# Patient Record
Sex: Female | Born: 1985 | Race: White | Hispanic: No | Marital: Married | Smoking: Current every day smoker
Health system: Southern US, Community
[De-identification: ages and names within clinical notes are randomized; demographics above are authoritative.]

## PROBLEM LIST (undated history)

## (undated) DIAGNOSIS — B182 Chronic viral hepatitis C: Secondary | ICD-10-CM

## (undated) DIAGNOSIS — F329 Major depressive disorder, single episode, unspecified: Secondary | ICD-10-CM

## (undated) DIAGNOSIS — F419 Anxiety disorder, unspecified: Secondary | ICD-10-CM

## (undated) DIAGNOSIS — F32A Depression, unspecified: Secondary | ICD-10-CM

## (undated) HISTORY — DX: Chronic viral hepatitis C: B18.2

## (undated) HISTORY — DX: Major depressive disorder, single episode, unspecified: F32.9

## (undated) HISTORY — DX: Depression, unspecified: F32.A

## (undated) HISTORY — DX: Anxiety disorder, unspecified: F41.9

---
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
## 2006-10-27 ENCOUNTER — Emergency Department: Payer: Self-pay | Admitting: Emergency Medicine

## 2008-11-10 ENCOUNTER — Emergency Department: Payer: Self-pay | Admitting: Emergency Medicine

## 2009-01-09 ENCOUNTER — Emergency Department: Payer: Self-pay | Admitting: Emergency Medicine

## 2009-03-26 ENCOUNTER — Emergency Department: Payer: Self-pay | Admitting: Unknown Physician Specialty

## 2009-04-29 ENCOUNTER — Emergency Department: Payer: Self-pay | Admitting: Emergency Medicine

## 2009-06-27 ENCOUNTER — Emergency Department: Payer: Self-pay | Admitting: Emergency Medicine

## 2009-08-22 IMAGING — CT CT HEAD WITHOUT CONTRAST
2 series · 15 of 30 positions shown, 19 images · non-contrast
Comparison: none

REASON FOR EXAM: headache sinus congestion (please also evaluate sinuses)
report of history of Bu
COMMENTS:

[Series 2: without · axial · non-contrast · 0.41mm/px · z∈[-156,-42]mm · 13 of 35 slices shown, 17 images]
[im 3/35  brain]
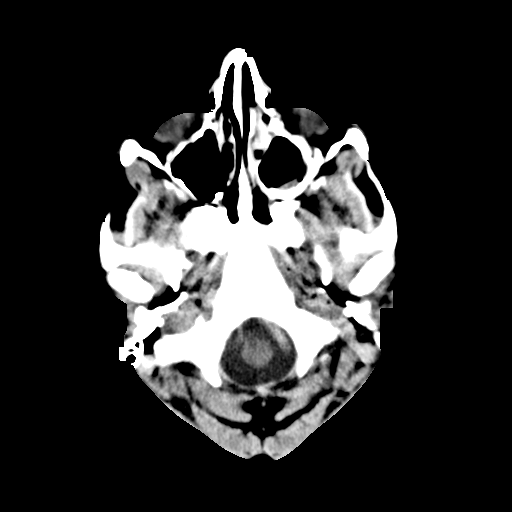
[im 3/35  bone]
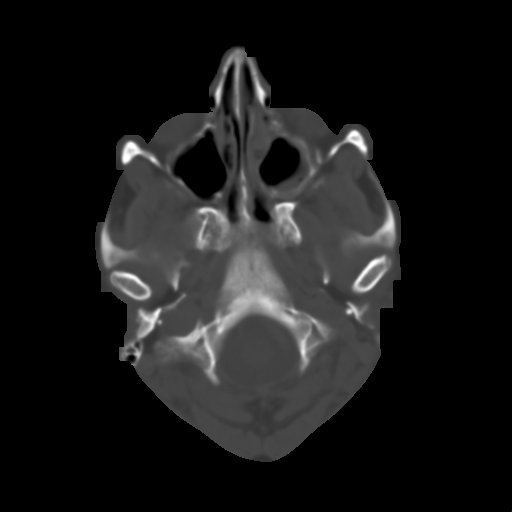
[im 5/35  brain]
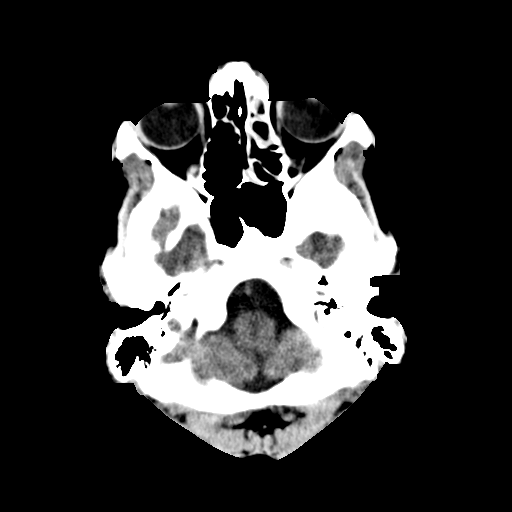
[im 8/35  brain]
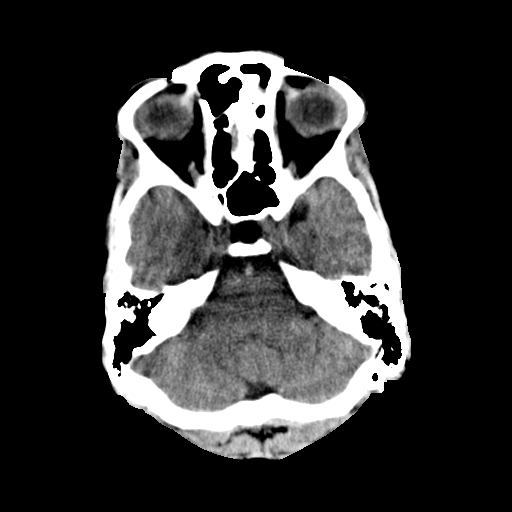
[im 10/35  brain]
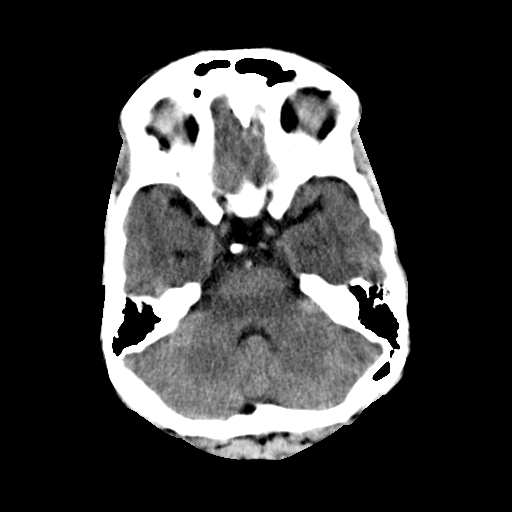
[im 13/35  brain]
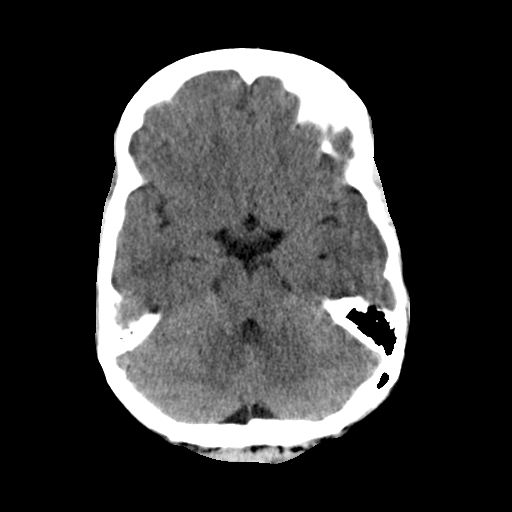
[im 13/35  bone]
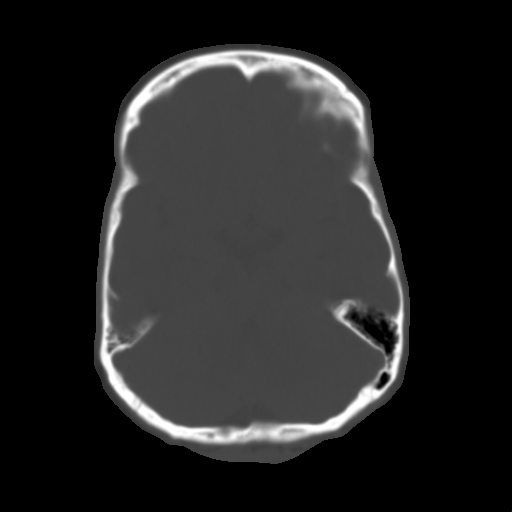
[im 15/35  brain]
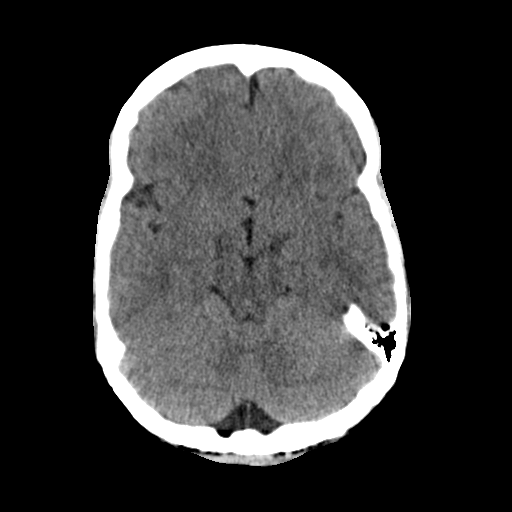
[im 18/35  brain]
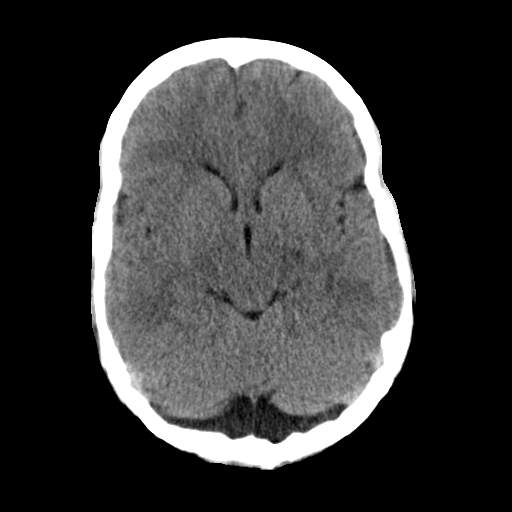
[im 20/35  brain]
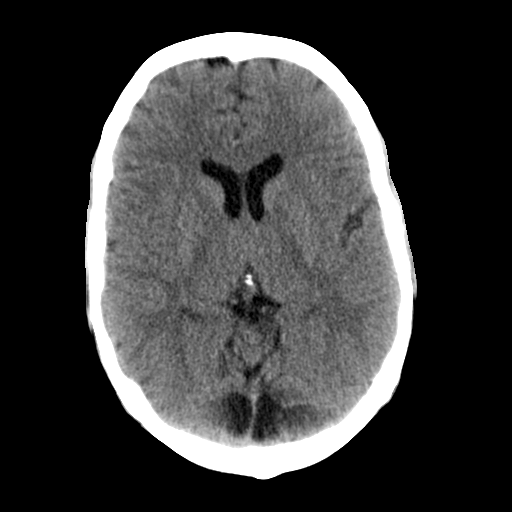
[im 22/35  brain]
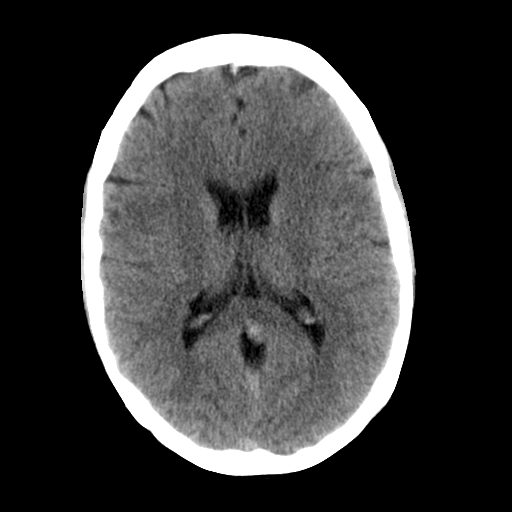
[im 22/35  bone]
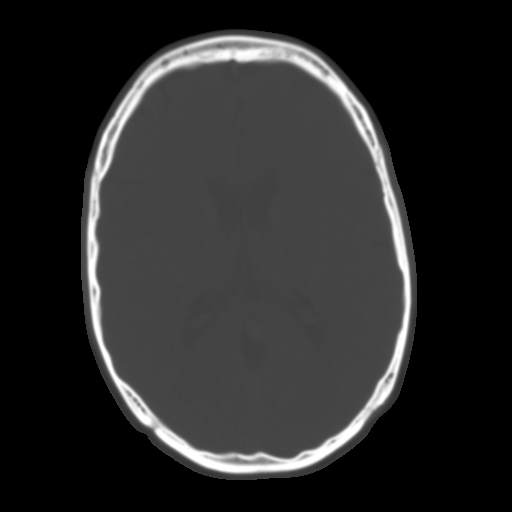
[im 25/35  brain]
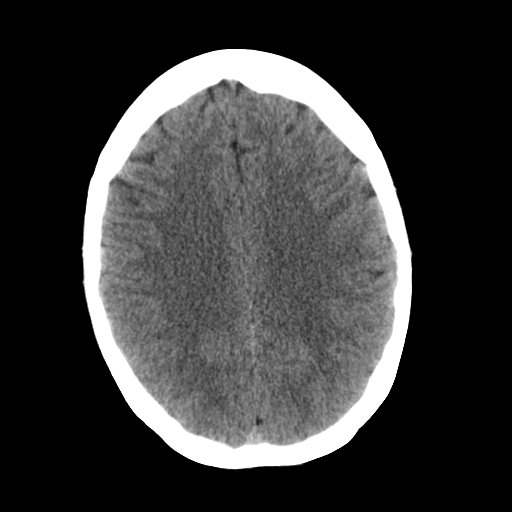
[im 27/35  brain]
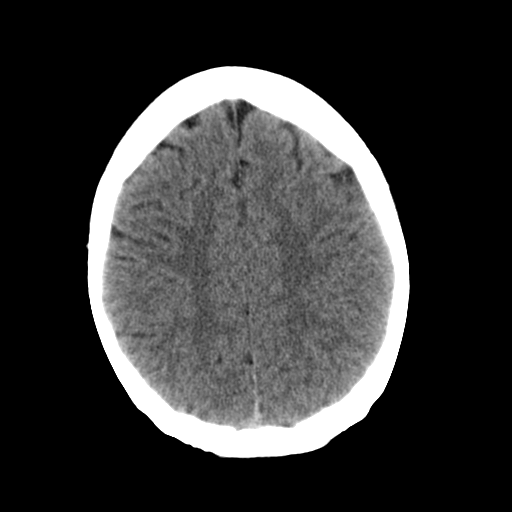
[im 30/35  brain]
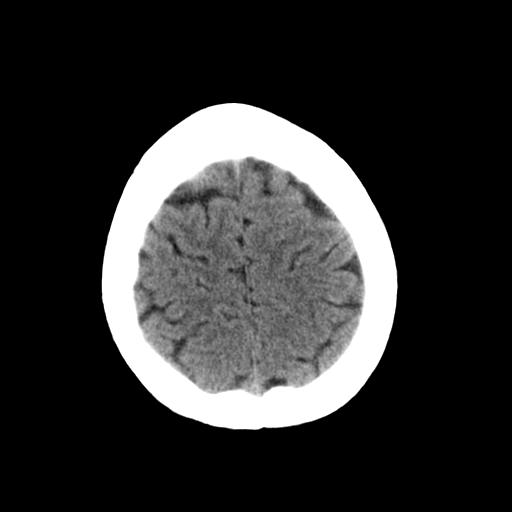
[im 32/35  brain]
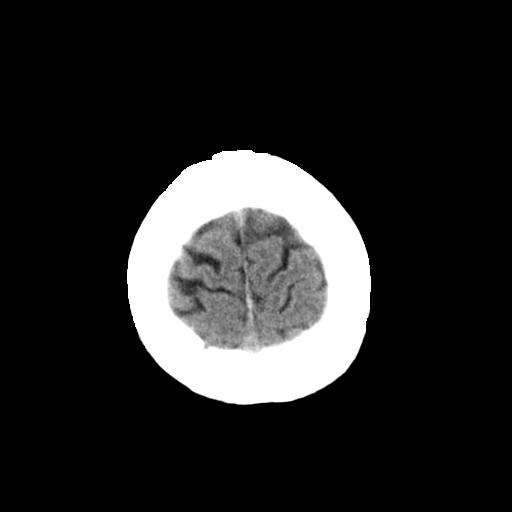
[im 32/35  bone]
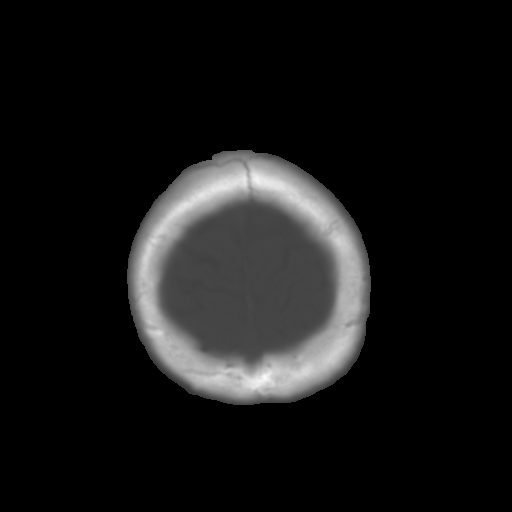

[Series 3: bone · axial · 0.41mm/px · z∈[-156,-136]mm · 2 of 35 slices shown]
[im 3/35  bone]
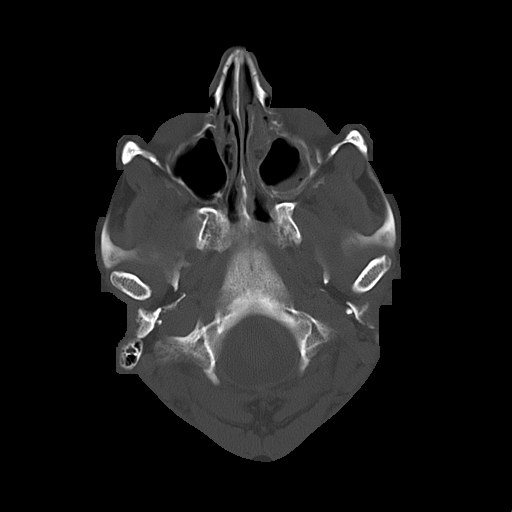
[im 8/35  bone]
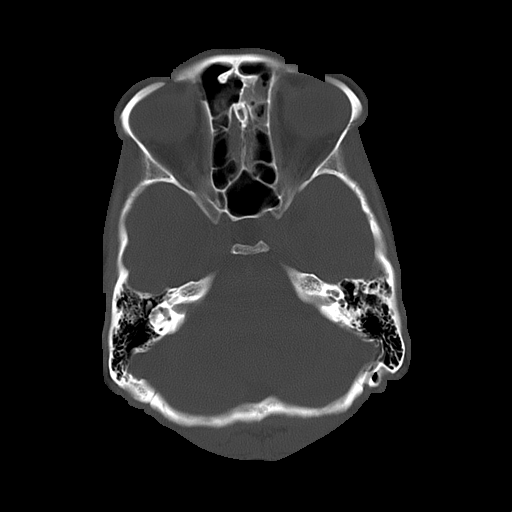

[15 of 30 positions shown; findings below may reference images not displayed]

PROCEDURE:     CT  - CT HEAD WITHOUT CONTRAST  - January 09, 2009  [DATE]

RESULT:     Axial noncontrast CT scanning was performed through the brain at
5 mm intervals and slice thicknesses.

The ventricles are normal in size and position. There is a megacisterna
magna present which is a normal variant. There is no evidence of
intracranial hemorrhage nor intracranial mass effect. There are no findings
suspicious for an acute evolving ischemic infarction. The cerebellum and
brainstem exhibit no acute abnormality.

At bone window settings there is mucoperiosteal thickening involving the
ethmoid and left maxillary sinuses with an air-fluid level noted in the left
maxillary sinus where visualized. Right maxillary sinus as well as the
sphenoid sinuses and the frontal sinuses are grossly clear. The mastoid air
cells are well pneumatized. There is no evidence of an acute skull fracture.
IMPRESSION: 1. I see no acute intracranial abnormality of the brain.
2. There is evidence of sinus inflammation involving the ethmoid and left
maxillary sinuses with an air-fluid level in the left maxillary sinus.

A preliminary report was sent to the [HOSPITAL] the conclusion
of the study.

## 2009-10-09 ENCOUNTER — Inpatient Hospital Stay: Payer: Self-pay | Admitting: Psychiatry

## 2009-11-04 ENCOUNTER — Emergency Department: Payer: Self-pay | Admitting: Emergency Medicine

## 2009-11-06 IMAGING — CR DG CHEST 2V
1 series · 2 of 2 positions shown · non-contrast
Comparison: none

REASON FOR EXAM: chest pain
COMMENTS:

PROCEDURE:     DXR - DXR CHEST PA (OR AP) AND LATERAL  - March 26, 2009 [DATE]
RESULT:     The lung fields are clear. The heart, mediastinal and osseous
structures reveal no significant abnormalities.

[Series 1: view not recorded · 0.17mm/px · 2 of 2 slices shown]
[im 1/2]
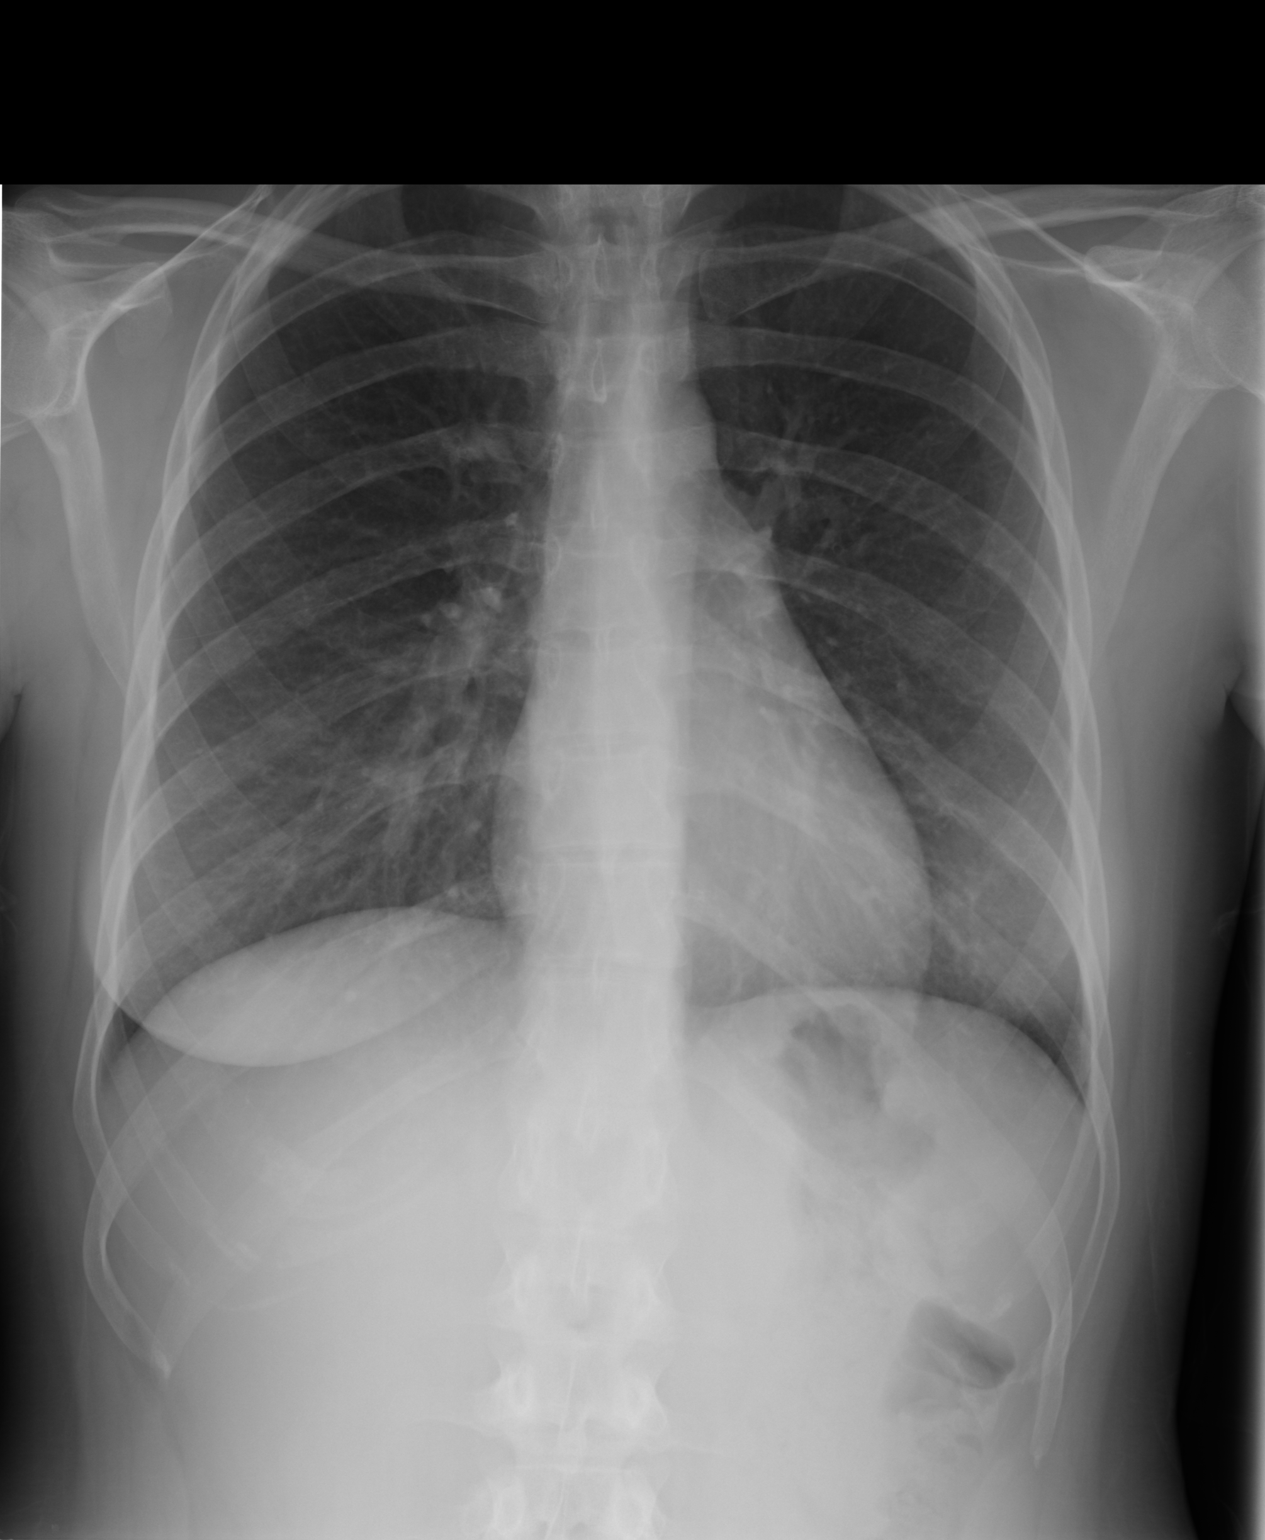
[im 2/2]
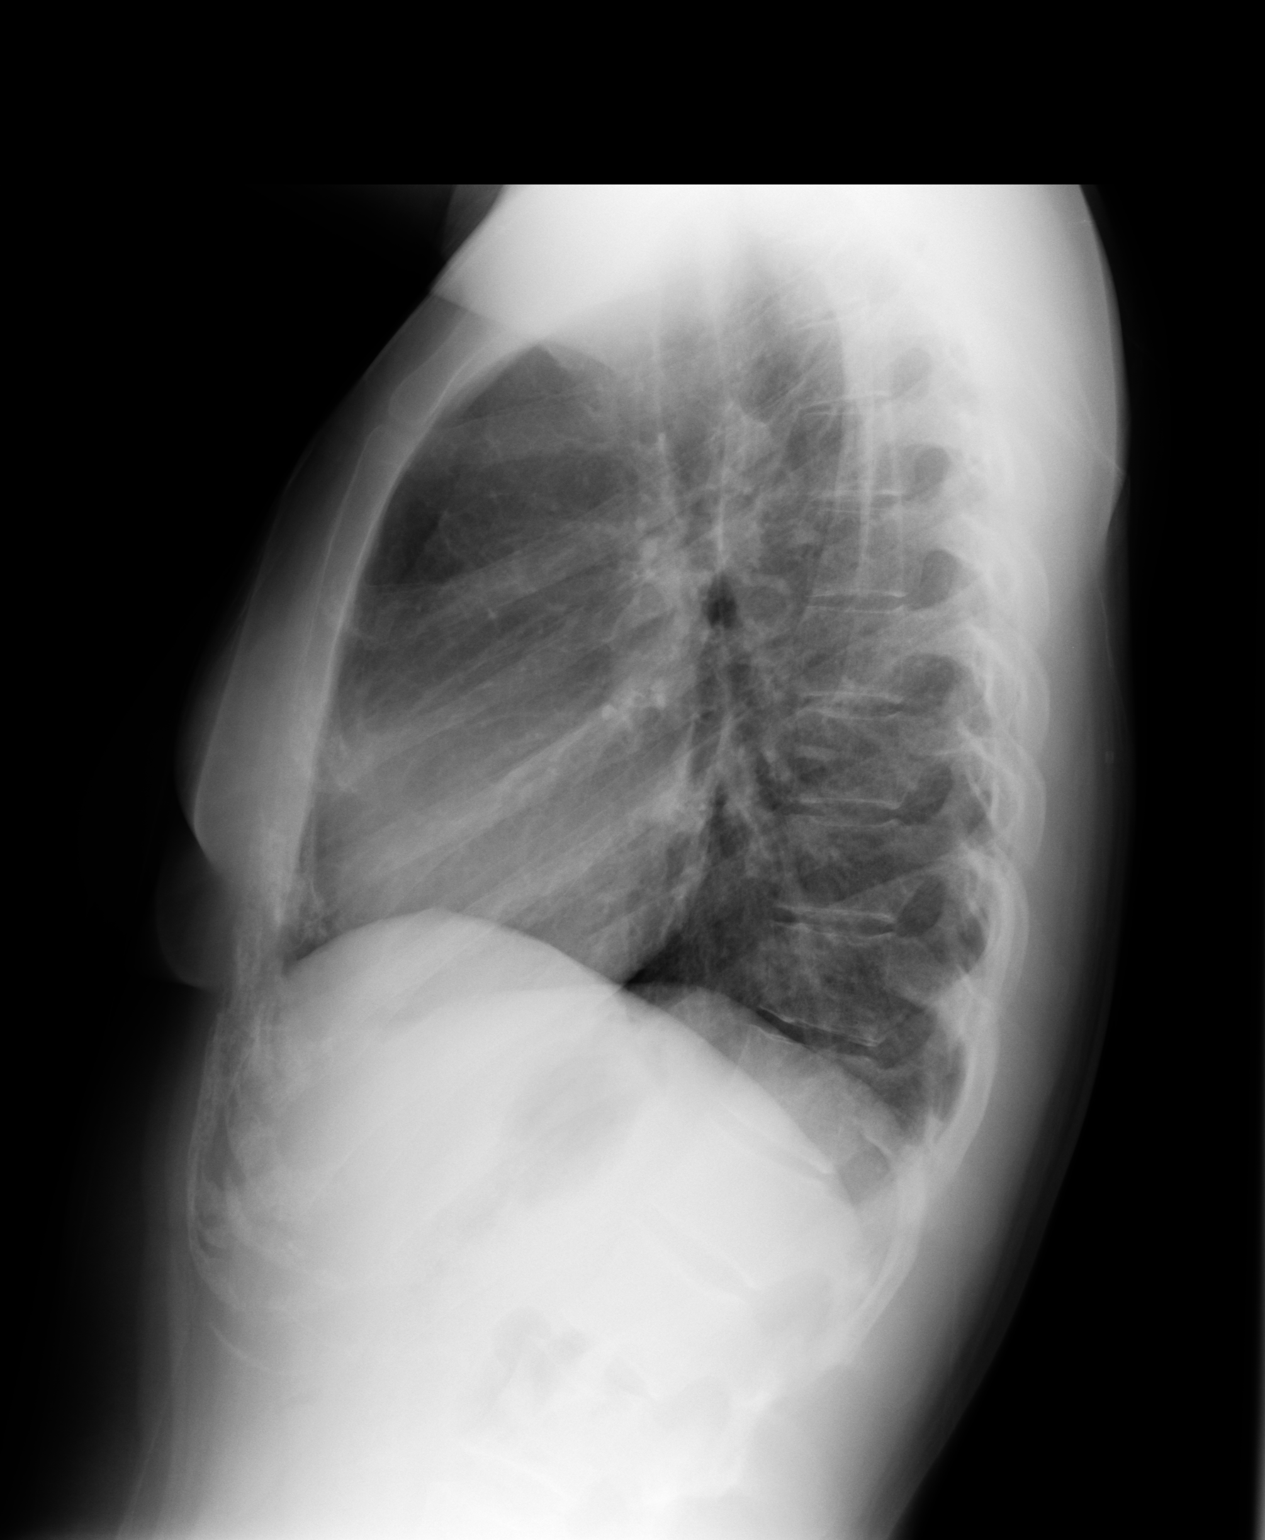

[2 of 2 positions shown; findings below may reference images not displayed]

IMPRESSION: 1.     No acute changes are identified.

## 2010-02-11 ENCOUNTER — Emergency Department: Payer: Self-pay | Admitting: Emergency Medicine

## 2010-07-21 ENCOUNTER — Emergency Department: Payer: Self-pay | Admitting: Emergency Medicine

## 2014-02-22 ENCOUNTER — Inpatient Hospital Stay: Payer: Self-pay | Admitting: Obstetrics and Gynecology

## 2014-02-22 LAB — CBC WITH DIFFERENTIAL/PLATELET
Basophil #: 0.1 10*3/uL (ref 0.0–0.1)
Basophil %: 0.6 %
EOS PCT: 0.1 %
Eosinophil #: 0 10*3/uL (ref 0.0–0.7)
HCT: 39.1 % (ref 35.0–47.0)
HGB: 13.3 g/dL (ref 12.0–16.0)
Lymphocyte #: 1.9 10*3/uL (ref 1.0–3.6)
Lymphocyte %: 11.6 %
MCH: 32.3 pg (ref 26.0–34.0)
MCHC: 34 g/dL (ref 32.0–36.0)
MCV: 95 fL (ref 80–100)
MONO ABS: 0.7 x10 3/mm (ref 0.2–0.9)
Monocyte %: 4.4 %
NEUTROS PCT: 83.3 %
Neutrophil #: 14 10*3/uL — ABNORMAL HIGH (ref 1.4–6.5)
Platelet: 394 10*3/uL (ref 150–440)
RBC: 4.11 10*6/uL (ref 3.80–5.20)
RDW: 12.9 % (ref 11.5–14.5)
WBC: 16.8 10*3/uL — AB (ref 3.6–11.0)

## 2014-02-23 LAB — HEMATOCRIT: HCT: 31.7 % — AB (ref 35.0–47.0)

## 2014-05-12 ENCOUNTER — Ambulatory Visit: Payer: Self-pay | Admitting: Internal Medicine

## 2014-11-26 ENCOUNTER — Ambulatory Visit: Payer: Self-pay | Admitting: Physician Assistant

## 2015-02-26 NOTE — H&P (Signed)
L&D Evaluation:  History:  HPI 29 year old G1 p0 with EDC=02/23/2014  by LMP= 05/19/2013 presents with painful contractions since 0200 this AM. Denies LOF or bleeding. Has nausea and urinary frequency. PNC at [facility] for early care, a negative first trimester test and MSAFP, anxiety (was on Effexor prior to pregnancy), genital warts, and a net weight gain 2#.  received TDAP 2/25. LABS: O POS, RNI, VI, GBS negative. Wants to bottle feed   Presents with contractions   Patient's Medical History Anxiety   Patient's Surgical History none   Medications Pre Natal Vitamins   Allergies NKDA   Social History tobacco  1/2 PPD   Family History Non-Contributory   ROS:  ROS see HPI   Exam:  General moaning with ctxs, constantly changing positions   Mental Status clear   Chest clear   Heart normal sinus rhythm, no murmur/gallop/rubs   Abdomen gravid, tender with contractions   Estimated Fetal Weight Average for gestational age, 7#   Fetal Position cephalic   Edema no edema   Pelvic 3/C/-1   Mebranes Intact   FHT baseline 125-130 with accels to 150 and ?variable/early  decels with some ctxs   FHT Description mod variability   Ucx q2-5 min apart (difficult to pick up with toco)   Skin dry   Impression:  Impression IUP at 39.6 weeks in early labor   Plan:  Plan EFM/NST, monitor contractions and for cervical change, monitor BP, Admit. Stadol for pain. Zofran for nausea. Epidural when available.   Electronic Signatures: Trinna BalloonGutierrez, Rhyan Wolters L (CNM)  (Signed 07-Keathley-15 08:37)  Authored: L&D Evaluation   Last Updated: 07-Amborn-15 08:37 by Trinna BalloonGutierrez, Belinda Bringhurst L (CNM)

## 2016-04-27 ENCOUNTER — Ambulatory Visit (INDEPENDENT_AMBULATORY_CARE_PROVIDER_SITE_OTHER): Payer: Self-pay | Admitting: Family Medicine

## 2016-04-27 ENCOUNTER — Other Ambulatory Visit: Payer: Self-pay | Admitting: Family Medicine

## 2016-04-27 ENCOUNTER — Encounter: Payer: Self-pay | Admitting: Family Medicine

## 2016-04-27 VITALS — BP 102/62 | HR 86 | Temp 98.5°F | Resp 16 | Ht 69.0 in | Wt 208.0 lb

## 2016-04-27 DIAGNOSIS — Z7189 Other specified counseling: Secondary | ICD-10-CM

## 2016-04-27 DIAGNOSIS — R894 Abnormal immunological findings in specimens from other organs, systems and tissues: Secondary | ICD-10-CM

## 2016-04-27 DIAGNOSIS — N3281 Overactive bladder: Secondary | ICD-10-CM | POA: Insufficient documentation

## 2016-04-27 DIAGNOSIS — Z8249 Family history of ischemic heart disease and other diseases of the circulatory system: Secondary | ICD-10-CM | POA: Insufficient documentation

## 2016-04-27 DIAGNOSIS — F418 Other specified anxiety disorders: Secondary | ICD-10-CM

## 2016-04-27 DIAGNOSIS — Z720 Tobacco use: Secondary | ICD-10-CM

## 2016-04-27 DIAGNOSIS — G2581 Restless legs syndrome: Secondary | ICD-10-CM | POA: Insufficient documentation

## 2016-04-27 DIAGNOSIS — F419 Anxiety disorder, unspecified: Secondary | ICD-10-CM

## 2016-04-27 DIAGNOSIS — F32A Depression, unspecified: Secondary | ICD-10-CM | POA: Insufficient documentation

## 2016-04-27 DIAGNOSIS — F17200 Nicotine dependence, unspecified, uncomplicated: Secondary | ICD-10-CM | POA: Insufficient documentation

## 2016-04-27 DIAGNOSIS — Z7689 Persons encountering health services in other specified circumstances: Secondary | ICD-10-CM

## 2016-04-27 DIAGNOSIS — R768 Other specified abnormal immunological findings in serum: Secondary | ICD-10-CM

## 2016-04-27 DIAGNOSIS — F329 Major depressive disorder, single episode, unspecified: Secondary | ICD-10-CM | POA: Insufficient documentation

## 2016-04-27 MED ORDER — TOLTERODINE TARTRATE ER 4 MG PO CP24: 4.0000 mg | ORAL_CAPSULE | Freq: Every day | ORAL | Status: DC

## 2016-04-27 MED ORDER — GABAPENTIN 300 MG PO CAPS: 300.0000 mg | ORAL_CAPSULE | Freq: Three times a day (TID) | ORAL | Status: DC

## 2016-04-27 MED ORDER — MIRTAZAPINE 30 MG PO TABS: 30.0000 mg | ORAL_TABLET | Freq: Every day | ORAL | Status: DC

## 2016-04-27 NOTE — Assessment & Plan Note (Signed)
Renew tolterdine once daily.

## 2016-04-27 NOTE — Progress Notes (Signed)
Subjective:    Patient ID: Kathryn Reid, female    DOB: 26-Jul-1986, 30 y.o.   MRN: [id number]  HPI: Kathryn Reid is a 30 y.o. female presenting on 04/27/2016 for Establish Care   HPI  Pt presents to establish care today. Previous care provider was Dr. Lacie Scotts-  About 5 mos ago; Had a provider in [place]- unsure of the name- seen last month  It has been 1 month since Her last PCP visit. Records from previous provider will be requested and reviewed. Current medical problems include: Anxiety and Depression: Treated by Lynett Fish. However provider in [place] changed her to Remeron and Effexor. Dr. Janeece Riggers had previously had her on clonazepam and seroquel. She is taking clonazepam 2-3 times as needed.  Overactive bladder- Taking oxybutnin and tolterdine 4mg  to help with voiding symptoms. Has been taking medications for 1 year.  RLS: Was previously taking gabapentin for RLS. Did help her symptoms. Has been out.  Hep C: Recently found out- found in [place]. 1 previous pregnancy- child is 2. Gave birth at [facility]. 1 monogamous. Previous history of multiple partners. No IV drug use. Has home-made tattoo's.   Health maintenance:  Pap smear: Feb 2017 ASCUS- due Feb 2018. Smoker 1 pack per day.      Past Medical History  Diagnosis Date  . Hep C w/ coma, chronic (HCC)   . Anxiety   . Depression    Social History   Social History  . Marital Status: Divorced    Spouse Name: N/A  . Number of Children: N/A  . Years of Education: N/A   Occupational History  . Not on file.   Social History Main Topics  . Smoking status: Current Every Day Smoker -- 1.00 packs/day  . Smokeless tobacco: Not on file  . Alcohol Use: No  . Drug Use: No  . Sexual Activity: Not on file   Other Topics Concern  . Not on file   Social History Narrative  . No narrative on file   Family History  Problem Relation Age of Onset  . Alcohol abuse Mother   . Diabetes Maternal Grandmother   . Cancer Maternal Grandmother      lymphoma  . Heart disease Maternal Grandmother   . Stroke Maternal Grandmother    No current outpatient prescriptions on file prior to visit.   No current facility-administered medications on file prior to visit.    Review of Systems  Constitutional: Negative for fever and chills.  HENT: Negative.   Respiratory: Negative for cough, chest tightness and wheezing.   Cardiovascular: Negative for chest pain and leg swelling.  Gastrointestinal: Negative for nausea, vomiting, abdominal pain, diarrhea and constipation.  Endocrine: Negative.  Negative for cold intolerance, heat intolerance, polydipsia, polyphagia and polyuria.  Genitourinary: Positive for urgency and frequency. Negative for dysuria and difficulty urinating.  Musculoskeletal: Negative.   Neurological: Negative for dizziness, light-headedness and numbness.  Psychiatric/Behavioral: Negative.    Per HPI unless specifically indicated above     Objective:    BP 102/62 mmHg  Pulse 86  Temp(Src) 98.5 F (36.9 C) (Oral)  Resp 16  Ht 5\' 9"  (1.753 m)  Wt 208 lb (94.348 kg)  BMI 30.70 kg/m2  Wt Readings from Last 3 Encounters:  04/27/16 208 lb (94.348 kg)    Depression screen PHQ 2/9 04/27/2016  Decreased Interest 2  Down, Depressed, Hopeless 0  PHQ - 2 Score 2  Altered sleeping 2  Tired, decreased energy 1  Change in  appetite 1  Feeling bad or failure about yourself  0  Trouble concentrating 1  Moving slowly or fidgety/restless 0  Suicidal thoughts 0  PHQ-9 Score 7     Physical Exam  Constitutional: She is oriented to person, place, and time. She appears well-developed and well-nourished.  HENT:  Head: Normocephalic and atraumatic.  Neck: Neck supple.  Cardiovascular: Normal rate, regular rhythm and normal heart sounds.  Exam reveals no gallop and no friction rub.   No murmur heard. Pulmonary/Chest: Effort normal and breath sounds normal. She has no wheezes. She exhibits no tenderness.  Abdominal: Soft.  Normal appearance and bowel sounds are normal. She exhibits no distension and no mass. There is no tenderness. There is no rebound and no guarding.  Musculoskeletal: Normal range of motion. She exhibits no edema or tenderness.  Lymphadenopathy:    She has no cervical adenopathy.  Neurological: She is alert and oriented to person, place, and time.  Skin: Skin is warm and dry.  Psychiatric: Her speech is normal and behavior is normal. Judgment and thought content normal. Her affect is blunt. Cognition and memory are normal.   Results for orders placed or performed in visit on 02/22/14  CBC with Differential/Platelet  Result Value Ref Range   WBC 16.8 (H) 3.6-11.0 x10 3/mm 3   RBC 4.11 3.80-5.20 X10 6/mm 3   HGB 13.3 12.0-16.0 g/dL   HCT 44.039.1 10.2-72.535.0-47.0 %   MCV 95 80-100 fL   MCH 32.3 26.0-34.0 pg   MCHC 34.0 32.0-36.0 g/dL   RDW 36.612.9 44.0-34.711.5-14.5 %   Platelet 394 150-440 x10 3/mm 3   Neutrophil % 83.3 %   Lymphocyte % 11.6 %   Monocyte % 4.4 %   Eosinophil % 0.1 %   Basophil % 0.6 %   Neutrophil # 14.0 (H) 1.4-6.5 x10 3/mm 3   Lymphocyte # 1.9 1.0-3.6 x10 3/mm 3   Monocyte # 0.7 0.2-0.9 x10 3/mm    Eosinophil # 0.0 0.0-0.7 x10 3/mm 3   Basophil # 0.1 0.0-0.1 x10 3/mm 3  Hematocrit  Result Value Ref Range   HCT 31.7 (L) 35.0-47.0 %      Assessment & Plan:   Problem List Items Addressed This Visit      Genitourinary   Overactive bladder    Renew tolterdine once daily.       Relevant Medications   tolterodine (DETROL LA) 4 MG 24 hr capsule     Other   Anxiety and depression    Managed by Dr. Janeece RiggersSu.  Will refill Remeron started by [place] provider for 1 mos until she can see Dr. Janeece RiggersSu.        Relevant Medications   mirtazapine (REMERON) 30 MG tablet   Other Relevant Orders   BASIC METABOLIC PANEL WITH GFR   Hepatitis C antibody test positive    Test RNA of virus. Refer to GI with chronic infection for treatment.       Relevant Orders   Hepatitis C Antibody   HCV-RNA,  Quant Real-Time PCR w/reflex   Family history of heart disease   Relevant Orders   Lipid Profile   RLS (restless legs syndrome)    Treated with gabapentin in the past. Would like to restart treatment. Will renew medications. Check CBC and iron studies for impact on RLS.       Relevant Medications   gabapentin (NEURONTIN) 300 MG capsule   Other Relevant Orders   CBC with Differential/Platelet   Ferritin   Current  smoker    Pt encouraged to quit smoking.        Other Visit Diagnoses    Encounter to establish care    -  Primary       Meds ordered this encounter  Medications  . cyclobenzaprine (FLEXERIL) 10 MG tablet    Sig:     Refill:  4  . DISCONTD: gabapentin (NEURONTIN) 300 MG capsule    Sig: Take 300 mg by mouth.  . DISCONTD: tolterodine (DETROL LA) 4 MG 24 hr capsule    Sig: Take 1 capsule (4 mg total) by mouth daily. Reported on 04/27/2016    Dispense:  30 capsule    Refill:  11    Order Specific Question:  Supervising Provider    Answer:  Janeann Forehand [phone]  . levonorgestrel (MIRENA) 20 MCG/24HR IUD    Sig: 1 each by Intrauterine route once.  . gabapentin (NEURONTIN) 300 MG capsule    Sig: Take 1 capsule (300 mg total) by mouth 3 (three) times daily.    Dispense:  90 capsule    Refill:  11    Order Specific Question:  Supervising Provider    Answer:  Janeann Forehand [phone]  . mirtazapine (REMERON) 30 MG tablet    Sig: Take 1 tablet (30 mg total) by mouth at bedtime.    Dispense:  30 tablet    Refill:  0    Order Specific Question:  Supervising Provider    Answer:  Janeann Forehand [phone]  . tolterodine (DETROL LA) 4 MG 24 hr capsule    Sig: Take 1 capsule (4 mg total) by mouth daily. Reported on 04/27/2016    Dispense:  30 capsule    Refill:  11    Order Specific Question:  Supervising Provider    Answer:  Janeann Forehand [phone]      Follow up plan: Return in about 2 months (around 06/28/2016) for RLS.Marland Kitchen

## 2016-04-27 NOTE — Assessment & Plan Note (Signed)
Treated with gabapentin in the past. Would like to restart treatment. Will renew medications. Check CBC and iron studies for impact on RLS.

## 2016-04-27 NOTE — Patient Instructions (Signed)
We will check some lab work to follow-up on the antibody screen found in [place].    I have filled your medications for you today.

## 2016-04-27 NOTE — Assessment & Plan Note (Signed)
Pt encouraged to quit smoking  

## 2016-04-27 NOTE — Assessment & Plan Note (Signed)
Managed by Dr. Su.  Will refill RemerJaneece Riggerson started by [place] provider for 1 mos until she can see Dr. Janeece RiggersSu.

## 2016-04-27 NOTE — Assessment & Plan Note (Signed)
Test RNA of virus. Refer to GI with chronic infection for treatment.

## 2016-04-28 LAB — CBC WITH DIFFERENTIAL/PLATELET
BASOS ABS: 0 {cells}/uL (ref 0–200)
Basophils Relative: 0 %
EOS PCT: 7 %
Eosinophils Absolute: 602 cells/uL — ABNORMAL HIGH (ref 15–500)
HCT: 39 % (ref 35.0–45.0)
Hemoglobin: 13.1 g/dL (ref 11.7–15.5)
Lymphocytes Relative: 37 %
Lymphs Abs: 3182 cells/uL (ref 850–3900)
MCH: 32 pg (ref 27.0–33.0)
MCHC: 33.6 g/dL (ref 32.0–36.0)
MCV: 95.4 fL (ref 80.0–100.0)
MONOS PCT: 6 %
MPV: 9.2 fL (ref 7.5–12.5)
Monocytes Absolute: 516 cells/uL (ref 200–950)
NEUTROS PCT: 50 %
Neutro Abs: 4300 cells/uL (ref 1500–7800)
PLATELETS: 366 10*3/uL (ref 140–400)
RBC: 4.09 MIL/uL (ref 3.80–5.10)
RDW: 13.3 % (ref 11.0–15.0)
WBC: 8.6 10*3/uL (ref 3.8–10.8)

## 2016-04-28 LAB — LIPID PANEL
Cholesterol: 202 mg/dL — ABNORMAL HIGH (ref 125–200)
HDL: 51 mg/dL (ref >=46)
LDL Cholesterol: 119 mg/dL (ref <130)
Total CHOL/HDL Ratio: 4 Ratio (ref <=5.0)
Triglycerides: 161 mg/dL — ABNORMAL HIGH (ref <150)
VLDL: 32 mg/dL — ABNORMAL HIGH (ref <30)

## 2016-04-28 LAB — BASIC METABOLIC PANEL WITH GFR
BUN: 7 mg/dL (ref 7–25)
CALCIUM: 8.9 mg/dL (ref 8.6–10.2)
CO2: 26 mmol/L (ref 20–31)
CREATININE: 0.69 mg/dL (ref 0.50–1.10)
Chloride: 101 mmol/L (ref 98–110)
GLUCOSE: 79 mg/dL (ref 65–99)
Potassium: 4.3 mmol/L (ref 3.5–5.3)
Sodium: 135 mmol/L (ref 135–146)

## 2016-04-28 LAB — FERRITIN: FERRITIN: 23 ng/mL (ref 10–154)

## 2016-04-28 LAB — HEPATITIS C ANTIBODY: HCV AB: REACTIVE — AB

## 2016-04-29 ENCOUNTER — Encounter: Payer: Self-pay | Admitting: Family Medicine

## 2016-04-29 LAB — HEPATITIS C RNA QUANTITATIVE: HCV Quantitative: NOT DETECTED IU/mL (ref <15)

## 2016-04-30 LAB — HCV RNA, QUANT REAL-TIME PCR W/REFLEX: HCV RNA, PCR, QN (Log): <1.18 LogIU/mL

## 2016-05-28 ENCOUNTER — Other Ambulatory Visit: Payer: Self-pay | Admitting: Family Medicine

## 2016-05-28 DIAGNOSIS — F329 Major depressive disorder, single episode, unspecified: Secondary | ICD-10-CM

## 2016-05-28 DIAGNOSIS — F419 Anxiety disorder, unspecified: Principal | ICD-10-CM

## 2016-05-28 DIAGNOSIS — F32A Depression, unspecified: Secondary | ICD-10-CM

## 2016-06-29 ENCOUNTER — Ambulatory Visit (INDEPENDENT_AMBULATORY_CARE_PROVIDER_SITE_OTHER): Payer: BLUE CROSS/BLUE SHIELD | Admitting: Family Medicine

## 2016-06-29 ENCOUNTER — Encounter: Payer: Self-pay | Admitting: Family Medicine

## 2016-06-29 VITALS — BP 101/57 | HR 78 | Temp 98.4°F | Resp 16 | Ht 69.0 in | Wt 211.8 lb

## 2016-06-29 DIAGNOSIS — N3281 Overactive bladder: Secondary | ICD-10-CM

## 2016-06-29 DIAGNOSIS — F418 Other specified anxiety disorders: Secondary | ICD-10-CM

## 2016-06-29 DIAGNOSIS — Z23 Encounter for immunization: Secondary | ICD-10-CM | POA: Diagnosis not present

## 2016-06-29 DIAGNOSIS — F32A Depression, unspecified: Secondary | ICD-10-CM

## 2016-06-29 DIAGNOSIS — F329 Major depressive disorder, single episode, unspecified: Secondary | ICD-10-CM

## 2016-06-29 DIAGNOSIS — G2581 Restless legs syndrome: Secondary | ICD-10-CM

## 2016-06-29 DIAGNOSIS — F419 Anxiety disorder, unspecified: Secondary | ICD-10-CM

## 2016-06-29 MED ORDER — CYCLOBENZAPRINE HCL 10 MG PO TABS: 10.0000 mg | ORAL_TABLET | Freq: Two times a day (BID) | ORAL | 11 refills | Status: DC

## 2016-06-29 MED ORDER — VENLAFAXINE HCL ER 75 MG PO CP24: 75.0000 mg | ORAL_CAPSULE | Freq: Every day | ORAL | 3 refills | Status: DC

## 2016-06-29 NOTE — Assessment & Plan Note (Signed)
Renewed Gabapentin and flexeril today to help with RLS symptoms. Reviewed non-pharmacological management such as avoiding alcohol, sleep hygiene.  Recheck 6 mos.

## 2016-06-29 NOTE — Assessment & Plan Note (Signed)
Controlled on current medications. Recheck 6 mos.

## 2016-06-29 NOTE — Assessment & Plan Note (Signed)
Managed by Dr. Su 

## 2016-06-29 NOTE — Patient Instructions (Addendum)
Restless Legs Syndrome Restless legs syndrome is a condition that causes uncomfortable feelings or sensations in the legs, especially while sitting or lying down. The sensations usually cause an overwhelming urge to move the legs. The arms can also sometimes be affected. The condition can range from mild to severe. The symptoms often interfere with a person's ability to sleep. CAUSES The cause of this condition is not known. RISK FACTORS This condition is more likely to develop in:  People who are older than age 50.  Pregnant women. In general, restless legs syndrome is more common in women than in men.  People who have a family history of the condition.  People who have certain medical conditions, such as iron deficiency, kidney disease, Parkinson disease, or nerve damage.  People who take certain medicines, such as medicines for high blood pressure, nausea, colds, allergies, depression, and some heart conditions. SYMPTOMS The main symptom of this condition is uncomfortable sensations in the legs. These sensations Vidrio be:  Described as pulling, tingling, prickling, throbbing, crawling, or burning.  Worse while you are sitting or lying down.  Worse during periods of rest or inactivity.  Worse at night, often interfering with your sleep.  Accompanied by a very strong urge to move your legs.  Temporarily relieved by movement of your legs. The sensations usually affect both sides of the body. The arms can also be affected, but this is rare. People who have this condition often have tiredness during the day because of their lack of sleep at night. DIAGNOSIS This condition Barman be diagnosed based on your description of the symptoms. You [place] also have tests, including blood tests, to check for other conditions that [place] lead to your symptoms. In some cases, you [place] be asked to spend some time in a sleep lab so your sleeping can be monitored. TREATMENT Treatment for this condition is  focused on managing the symptoms. Treatment [place] include:  Self-help and lifestyle changes.  Medicines. HOME CARE INSTRUCTIONS  Take medicines only as directed by your health care provider.  Try these methods to get temporary relief from the uncomfortable sensations:  Massage your legs.  Walk or stretch.  Take a cold or hot bath.  Practice good sleep habits. For example, go to bed and get up at the same time every day.  Exercise regularly.  Practice ways of relaxing, such as yoga or meditation.  Avoid caffeine and alcohol.  Do not use any tobacco products, including cigarettes, chewing tobacco, or electronic cigarettes. If you need help quitting, ask your health care provider.  Keep all follow-up visits as directed by your health care provider. This is important. SEEK MEDICAL CARE IF: Your symptoms do not improve with treatment, or they get worse.   This information is not intended to replace advice given to you by your health care provider. Make sure you discuss any questions you have with your health care provider.   Document Released: 09/25/2002 Document Revised: 02/19/2015 Document Reviewed: 10/01/2014 Elsevier Interactive Patient Education 2016 Elsevier Inc.  

## 2016-06-29 NOTE — Progress Notes (Signed)
Subjective:    Patient ID: Joelene Millin Tarnow, female    DOB: 1985-11-27, 30 y.o.   MRN: [id number]  HPI: Allysen Lazo Cowher is a 30 y.o. female presenting on 06/29/2016 for RLS (pt needs refill for flexeril)   HPI  Pt presents for follow-up of RLS symptoms. Taking gabapentin TID. Seems to help. Taking flexeril twice daily. The bulk of her symptoms are at bedtime. Takes flexeril in the afternoon and it helps with her symptoms. OAB symptoms are doing well on Detrol LA.  Would like her flu shot today.   Past Medical History:  Diagnosis Date  . Anxiety   . Depression   . Hep C w/ coma, chronic (HCC)     Current Outpatient Prescriptions on File Prior to Visit  Medication Sig  . clonazePAM (KLONOPIN) 1 MG tablet Take 1 tablet by mouth 3 (three) times daily as needed.  . gabapentin (NEURONTIN) 300 MG capsule Take 1 capsule (300 mg total) by mouth 3 (three) times daily.  Marland Kitchen levonorgestrel (MIRENA) 20 MCG/24HR IUD 1 each by Intrauterine route once.  Marland Kitchen oxybutynin (DITROPAN) 5 MG tablet Take 1 tablet by mouth daily.  . QUEtiapine (SEROQUEL) 200 MG tablet Take 1 tablet by mouth daily.  Marland Kitchen tolterodine (DETROL LA) 4 MG 24 hr capsule Take 1 capsule (4 mg total) by mouth daily. Reported on 04/27/2016   No current facility-administered medications on file prior to visit.     Review of Systems  Constitutional: Negative for chills and fever.  HENT: Negative.   Respiratory: Negative for cough, chest tightness and wheezing.   Cardiovascular: Negative for chest pain and leg swelling.  Gastrointestinal: Negative for abdominal pain, constipation, diarrhea, nausea and vomiting.  Endocrine: Negative.  Negative for cold intolerance, heat intolerance, polydipsia, polyphagia and polyuria.  Genitourinary: Negative for difficulty urinating and dysuria.  Musculoskeletal: Negative.   Neurological: Negative for dizziness, light-headedness and numbness.  Psychiatric/Behavioral: Negative.    Per HPI unless  specifically indicated above     Objective:    BP (!) 101/57 (BP Location: Right Arm, Patient Position: Sitting, Cuff Size: Normal)   Pulse 78   Temp 98.4 F (36.9 C) (Oral)   Resp 16   Ht '5\' 9"'  (1.753 m)   Wt 211 lb 12.8 oz (96.1 kg)   BMI 31.28 kg/m   Wt Readings from Last 3 Encounters:  06/29/16 211 lb 12.8 oz (96.1 kg)  04/27/16 208 lb (94.3 kg)    Physical Exam  Constitutional: She is oriented to person, place, and time. She appears well-developed and well-nourished.  HENT:  Head: Normocephalic and atraumatic.  Neck: Neck supple.  Cardiovascular: Normal rate, regular rhythm and normal heart sounds.  Exam reveals no gallop and no friction rub.   No murmur heard. Pulmonary/Chest: Effort normal and breath sounds normal. She has no wheezes. She exhibits no tenderness.  Abdominal: Soft. Normal appearance and bowel sounds are normal. She exhibits no distension and no mass. There is no tenderness. There is no rebound and no guarding.  Musculoskeletal: Normal range of motion. She exhibits no edema or tenderness.  Lymphadenopathy:    She has no cervical adenopathy.  Neurological: She is alert and oriented to person, place, and time.  Skin: Skin is warm and dry.   Results for orders placed or performed in visit on 04/27/16  Hepatitis C Antibody  Result Value Ref Range   HCV Ab REACTIVE (A) NEGATIVE  HCV-RNA, Quant Real-Time PCR w/reflex  Result Value Ref Range  HCV RNA, PCR, QN <15 IU/mL   HCV RNA, PCR, QN (Log) <1.18 LogIU/mL  BASIC METABOLIC PANEL WITH GFR  Result Value Ref Range   Sodium 135 135 - 146 mmol/L   Potassium 4.3 3.5 - 5.3 mmol/L   Chloride 101 98 - 110 mmol/L   CO2 26 20 - 31 mmol/L   Glucose, Bld 79 65 - 99 mg/dL   BUN 7 7 - 25 mg/dL   Creat 0.69 0.50 - 1.10 mg/dL   Calcium 8.9 8.6 - 10.2 mg/dL   GFR, Est African American >89 >=60 mL/min   GFR, Est Non African American >89 >=60 mL/min  Lipid Profile  Result Value Ref Range   Cholesterol 202 (H) 125 -  200 mg/dL   Triglycerides 161 (H) <150 mg/dL   HDL 51 >=46 mg/dL   Total CHOL/HDL Ratio 4.0 <=5.0 Ratio   VLDL 32 (H) <30 mg/dL   LDL Cholesterol 119 <130 mg/dL  CBC with Differential/Platelet  Result Value Ref Range   WBC 8.6 3.8 - 10.8 K/uL   RBC 4.09 3.80 - 5.10 MIL/uL   Hemoglobin 13.1 11.7 - 15.5 g/dL   HCT 39.0 35.0 - 45.0 %   MCV 95.4 80.0 - 100.0 fL   MCH 32.0 27.0 - 33.0 pg   MCHC 33.6 32.0 - 36.0 g/dL   RDW 13.3 11.0 - 15.0 %   Platelets 366 140 - 400 K/uL   MPV 9.2 7.5 - 12.5 fL   Neutro Abs 4,300 1,500 - 7,800 cells/uL   Lymphs Abs 3,182 850 - 3,900 cells/uL   Monocytes Absolute 516 200 - 950 cells/uL   Eosinophils Absolute 602 (H) 15 - 500 cells/uL   Basophils Absolute 0 0 - 200 cells/uL   Neutrophils Relative % 50 %   Lymphocytes Relative 37 %   Monocytes Relative 6 %   Eosinophils Relative 7 %   Basophils Relative 0 %   Smear Review Criteria for review not met   Ferritin  Result Value Ref Range   Ferritin 23 10 - 154 ng/mL  Hepatitis C RNA quantitative  Result Value Ref Range   HCV Quantitative Not Detected <15 IU/mL   HCV Quantitative Log NOT CALC <1.18 log 10      Assessment & Plan:   Problem List Items Addressed This Visit      Genitourinary   Overactive bladder    Controlled on current medications. Recheck 6 mos.         Other   Anxiety and depression    Managed by Dr. Kasandra Knudsen      RLS (restless legs syndrome) - Primary    Renewed Gabapentin and flexeril today to help with RLS symptoms. Reviewed non-pharmacological management such as avoiding alcohol, sleep hygiene.  Recheck 6 mos.       Relevant Medications   cyclobenzaprine (FLEXERIL) 10 MG tablet    Other Visit Diagnoses    Need for influenza vaccination       Relevant Orders   Flu Vaccine QUAD 36+ mos PF IM (Fluarix & Fluzone Quad PF) (Completed)      Meds ordered this encounter  Medications  . cyclobenzaprine (FLEXERIL) 10 MG tablet    Sig: Take 1 tablet (10 mg total) by mouth 2  (two) times daily.    Dispense:  60 tablet    Refill:  11    Order Specific Question:   Supervising Provider    Answer:   Arlis Porta [phone]  . venlafaxine XR (  EFFEXOR-XR) 75 MG 24 hr capsule    Sig: Take 1 capsule (75 mg total) by mouth daily. Reported on 04/27/2016    Dispense:  30 capsule    Refill:  3    Order Specific Question:   Supervising Provider    Answer:   Arlis Porta [[id number]]      Follow up plan: No Follow-up on file.

## 2016-09-08 ENCOUNTER — Ambulatory Visit
Admission: EM | Admit: 2016-09-08 | Discharge: 2016-09-08 | Disposition: A | Discharge status: Home / Self Care | Payer: BLUE CROSS/BLUE SHIELD | Attending: Family Medicine | Admitting: Family Medicine

## 2016-09-08 ENCOUNTER — Encounter: Payer: Self-pay | Admitting: *Deleted

## 2016-09-08 DIAGNOSIS — B86 Scabies: Secondary | ICD-10-CM | POA: Diagnosis not present

## 2016-09-08 DIAGNOSIS — R197 Diarrhea, unspecified: Secondary | ICD-10-CM | POA: Diagnosis not present

## 2016-09-08 DIAGNOSIS — R112 Nausea with vomiting, unspecified: Secondary | ICD-10-CM | POA: Diagnosis not present

## 2016-09-08 DIAGNOSIS — L03119 Cellulitis of unspecified part of limb: Secondary | ICD-10-CM

## 2016-09-08 MED ORDER — MUPIROCIN 2 % EX OINT: 1.0000 "application " | TOPICAL_OINTMENT | Freq: Three times a day (TID) | CUTANEOUS | 0 refills | Status: DC

## 2016-09-08 MED ORDER — PERMETHRIN 5 % EX CREA: TOPICAL_CREAM | CUTANEOUS | 0 refills | Status: DC

## 2016-09-08 MED ORDER — ONDANSETRON 8 MG PO TBDP: 8.0000 mg | ORAL_TABLET | Freq: Two times a day (BID) | ORAL | 0 refills | Status: DC

## 2016-09-08 MED ORDER — ONDANSETRON 8 MG PO TBDP
8.0000 mg | ORAL_TABLET | Freq: Once | ORAL | Status: AC
  Administered 2016-09-08: 8 mg via ORAL

## 2016-09-08 NOTE — ED Triage Notes (Signed)
N/V, and generalized rash, possibly insect bites, x2 days. Denies fever.

## 2016-09-08 NOTE — ED Provider Notes (Signed)
CSN: [id number]     Arrival date & time 09/08/16  1532 History   First MD Initiated Contact with Patient 09/08/16 1728     Chief Complaint  Patient presents with  . Insect Bite  . Nausea  . Emesis   (Consider location/radiation/quality/duration/timing/severity/associated sxs/prior Treatment) HPI This a 30 year old female who presents with 2 days history of vomiting and watery diarrhea and stomach pain. She has stated that the diarrhea has stopped as of yesterday but she continues to have the nausea and vomiting. She states that she is unable to even keep down fluids. She's also noticed a very itchy rash that she's had for 2 days. It affects arms legs and torso but spares her neck and face. She got it could possibly be flea bites since they have a new cat that was from a flea ridden home. 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 Several areas of excoriations that are starting to have secondary infections.      Past Medical History:  Diagnosis Date  . Anxiety   . Depression   . Hep C w/ coma, chronic (HCC)    History reviewed. No pertinent surgical history. Family History  Problem Relation Age of Onset  . Alcohol abuse Mother   . Diabetes Maternal Grandmother   . Cancer Maternal Grandmother     lymphoma  . Heart disease Maternal Grandmother   . Stroke Maternal Grandmother    Social History  Substance Use Topics  . Smoking status: Current Every Day Smoker    Packs/day: 1.00  . Smokeless tobacco: Current User  . Alcohol use No   OB History    No data available     Review of Systems  Constitutional: Positive for activity change and appetite change. Negative for chills, fatigue and fever.  Gastrointestinal: Positive for diarrhea, nausea and vomiting. Negative for abdominal distention, abdominal pain and constipation.  Skin: Positive for rash and wound.  All other systems reviewed and are negative.   Allergies  Other and Bee venom  Home  Medications   Prior to Admission medications   Medication Sig Start Date End Date Taking? Authorizing Provider  clonazePAM (KLONOPIN) 1 MG tablet Take 1 tablet by mouth 3 (three) times daily as needed. 04/01/16  Yes Historical Provider, MD  cyclobenzaprine (FLEXERIL) 10 MG tablet Take 1 tablet (10 mg total) by mouth 2 (two) times daily. 06/29/16  Yes Amy Rusty AusLauren Krebs, NP  oxybutynin (DITROPAN) 5 MG tablet Take 1 tablet by mouth daily. 03/27/16  Yes Historical Provider, MD  QUEtiapine (SEROQUEL) 200 MG tablet Take 1 tablet by mouth daily. 02/02/16  Yes Historical Provider, MD  tolterodine (DETROL LA) 4 MG 24 hr capsule Take 1 capsule (4 mg total) by mouth daily. Reported on 04/27/2016 04/27/16  Yes Amy Rusty AusLauren Krebs, NP  venlafaxine XR (EFFEXOR-XR) 75 MG 24 hr capsule Take 1 capsule (75 mg total) by mouth daily. Reported on 04/27/2016 06/29/16  Yes Amy Rusty AusLauren Krebs, NP  levonorgestrel (MIRENA) 20 MCG/24HR IUD 1 each by Intrauterine route once.    Historical Provider, MD  mupirocin ointment (BACTROBAN) 2 % Apply 1 application topically 3 (three) times daily. 09/08/16   Lutricia FeilWilliam P Aspynn Clover, PA-C  ondansetron (ZOFRAN ODT) 8 MG disintegrating tablet Take 1 tablet (8 mg total) by mouth 2 (two) times daily. 09/08/16   Lutricia FeilWilliam P Brek Reece, PA-C  permethrin (ELIMITE) 5 % cream Apply to affected area once. Do not reapply. 09/08/16   Lutricia FeilWilliam P Cheyenne Bordeaux, PA-C  Meds Ordered and Administered this Visit   Medications  ondansetron (ZOFRAN-ODT) disintegrating tablet 8 mg (8 mg Oral Given 09/08/16 1559)    BP 121/77 (BP Location: Left Arm)   Pulse 68   Temp (!) 94.8 F (34.9 C) (Tympanic)   Resp 16   Ht 5\' 9"  (1.753 m)   Wt 186 lb (84.4 kg)   SpO2 94%   BMI 27.47 kg/m  No data found.   Physical Exam  Urgent Care Course   Clinical Course     Procedures (including critical care time)  Labs Review Labs Reviewed - No data to display  Imaging Review No results found.   Visual Acuity Review  Right Eye  Distance:   Left Eye Distance:   Bilateral Distance:    Right Eye Near:   Left Eye Near:    Bilateral Near:         MDM   1. Scabies infestation   2. Nausea vomiting and diarrhea   3. Cellulitis of lower extremity, unspecified laterality    Discharge Medication List as of 09/08/2016  5:53 PM    START taking these medications   Details  mupirocin ointment (BACTROBAN) 2 % Apply 1 application topically 3 (three) times daily., Starting Tue 09/08/2016, Normal    ondansetron (ZOFRAN ODT) 8 MG disintegrating tablet Take 1 tablet (8 mg total) by mouth 2 (two) times daily., Starting Tue 09/08/2016, Normal    permethrin (ELIMITE) 5 % cream Apply to affected area once. Do not reapply., Normal      Plan: 1. Test/x-ray results and diagnosis reviewed with patient 2. rx as per orders; risks, benefits, potential side effects reviewed with patient 3. Recommend supportive treatment with  Bactroban on the areas that are having secondary infection. I reviewed in detail and have provided written information on how to treat the scabies including washing of the clothing sequestering of the non-washable items in garbage bag for 72 hours. Her partner will likely need to be treated but it cannot treat both of them without seeing him. Also given her Zofran to allow her to be able to hydrate better. She doesn't have to eat but when she does decide to return to being she should start with a brat diet and advance as tolerated 4. F/u prn if symptoms worsen or don't improve     Lutricia FeilWilliam P Glendoris Nodarse, PA-C 09/08/16 1809

## 2017-09-29 ENCOUNTER — Other Ambulatory Visit: Payer: Self-pay

## 2017-09-29 DIAGNOSIS — G2581 Restless legs syndrome: Secondary | ICD-10-CM

## 2018-03-10 ENCOUNTER — Telehealth: Payer: Self-pay | Admitting: Family Medicine

## 2018-03-10 NOTE — Telephone Encounter (Signed)
Reviewed chart. She has only been seen by Bjorn Pippin FNP twice, back in 2 months in 2017. She has not seen or established with any providers here. I would recommend that if she needs a rx EpiPen she Dolinger seek more immediate care at an Urgent Care or walk in clinic, as they should be able to write this rx for her. She would need to schedule as new patient to re-establish with [place] otherwise if preferred to proceed with discussing this rx.  Saralyn Pilar, DO [facility] 03/10/2018, 12:41 PM

## 2018-03-10 NOTE — Telephone Encounter (Signed)
Pt. Called said that she was going into drug rehab they saw  She was allergic to bees, before she can get into  Program she need a Epipen last time pt was in the office was 2017. Pt  Call back #[phone]

## 2019-03-29 LAB — HM PAP SMEAR: HM Pap smear: NEGATIVE

## 2020-04-09 ENCOUNTER — Telehealth: Payer: Self-pay | Admitting: Pharmacy Technician

## 2020-04-09 ENCOUNTER — Other Ambulatory Visit: Payer: Self-pay

## 2020-04-09 ENCOUNTER — Encounter: Payer: Self-pay | Admitting: Family

## 2020-04-09 ENCOUNTER — Ambulatory Visit (INDEPENDENT_AMBULATORY_CARE_PROVIDER_SITE_OTHER): Payer: Self-pay | Admitting: Family

## 2020-04-09 VITALS — BP 131/77 | HR 63 | Temp 97.9°F | Wt 238.0 lb

## 2020-04-09 DIAGNOSIS — R768 Other specified abnormal immunological findings in serum: Secondary | ICD-10-CM

## 2020-04-09 NOTE — Progress Notes (Signed)
Subjective:    Patient ID: Kathryn Reid, female    DOB: January 05, 1986, 34 y.o.   MRN: [id number]  Chief Complaint  Patient presents with  . Hepatitis C    HPI:  Kathryn Reid is a 34 y.o. female with previous medical history of polysubstance abuse, depression, anxiety, and hepatitis C presenting today for initial evaluation and treatment for hepatitis C.  Kathryn Reid was initially diagnosed with hepatitis C in 2017 when she was in treatment for polysubstance abuse. Risk factors for acquiring hepatitis C include previous history of IV drug use and homemade tattoos as well as sharing needles/razors. No previous history of blood transfusions prior to 1992 or sexual contact with known positive partners. No previous personal or family history of liver disease. Has not received treatment since initial diagnosis. Currently without symptoms and denies abdominal pain, nausea, vomiting, scleral icterus, jaundice, or fatigue. She has not currently using any recreational or illicit drugs. Smokes tobacco about 1 pack/day on average and not currently consuming alcohol.  Previous lab work reviewed from 04/27/2016 with positive hepatitis C antibody test and HCV viral load undetectable.  Allergies  Allergen Reactions  . Other Anaphylaxis    Bee stings  . Bee Venom       Outpatient Medications Prior to Visit  Medication Sig Dispense Refill  . oxybutynin (DITROPAN) 5 MG tablet Take 1 tablet by mouth daily.  0  . traZODone (DESYREL) 150 MG tablet Take 150 mg by mouth at bedtime.    . clonazePAM (KLONOPIN) 1 MG tablet Take 1 tablet by mouth 3 (three) times daily as needed. (Patient not taking: Reported on 04/09/2020)  2  . cyclobenzaprine (FLEXERIL) 10 MG tablet Take 1 tablet (10 mg total) by mouth 2 (two) times daily. (Patient not taking: Reported on 04/09/2020) 60 tablet 11  . levonorgestrel (MIRENA) 20 MCG/24HR IUD 1 each by Intrauterine route once. (Patient not taking: Reported on 04/09/2020)    . mupirocin  ointment (BACTROBAN) 2 % Apply 1 application topically 3 (three) times daily. (Patient not taking: Reported on 04/09/2020) 22 g 0  . ondansetron (ZOFRAN ODT) 8 MG disintegrating tablet Take 1 tablet (8 mg total) by mouth 2 (two) times daily. (Patient not taking: Reported on 04/09/2020) 6 tablet 0  . permethrin (ELIMITE) 5 % cream Apply to affected area once. Do not reapply. (Patient not taking: Reported on 04/09/2020) 60 g 0  . QUEtiapine (SEROQUEL) 200 MG tablet Take 1 tablet by mouth daily. (Patient not taking: Reported on 04/09/2020)  2  . tolterodine (DETROL LA) 4 MG 24 hr capsule Take 1 capsule (4 mg total) by mouth daily. Reported on 04/27/2016 (Patient not taking: Reported on 04/09/2020) 30 capsule 11  . venlafaxine XR (EFFEXOR-XR) 75 MG 24 hr capsule Take 1 capsule (75 mg total) by mouth daily. Reported on 04/27/2016 (Patient not taking: Reported on 04/09/2020) 30 capsule 3   No facility-administered medications prior to visit.     Past Medical History:  Diagnosis Date  . Anxiety   . Depression   . Hep C w/ coma, chronic (HCC)       History reviewed. No pertinent surgical history.    Family History  Problem Relation Age of Onset  . Alcohol abuse Mother   . Factor V Leiden deficiency Mother   . Diabetes Maternal Grandmother   . Cancer Maternal Grandmother        lymphoma  . Heart disease Maternal Grandmother   . Stroke Maternal Grandmother   .  Factor V Leiden deficiency Maternal Grandfather   . Diabetes Maternal Grandfather   . Heart disease Maternal Grandfather       Social History   Socioeconomic History  . Marital status: Divorced    Spouse name: Not on file  . Number of children: Not on file  . Years of education: Not on file  . Highest education level: Not on file  Occupational History  . Not on file  Tobacco Use  . Smoking status: Current Every Day Smoker    Packs/day: 1.00  . Smokeless tobacco: Former Clinical biochemist  . Vaping Use: Every day  Substance  and Sexual Activity  . Alcohol use: No  . Drug use: Not Currently  . Sexual activity: Not on file  Other Topics Concern  . Not on file  Social History Narrative  . Not on file   Social Determinants of Health   Financial Resource Strain:   . Difficulty of Paying Living Expenses:   Food Insecurity:   . Worried About Programme researcher, broadcasting/film/video in the Last Year:   . Barista in the Last Year:   Transportation Needs:   . Freight forwarder (Medical):   Marland Kitchen Lack of Transportation (Non-Medical):   Physical Activity:   . Days of Exercise per Week:   . Minutes of Exercise per Session:   Stress:   . Feeling of Stress :   Social Connections:   . Frequency of Communication with Friends and Family:   . Frequency of Social Gatherings with Friends and Family:   . Attends Religious Services:   . Active Member of Clubs or Organizations:   . Attends Banker Meetings:   Marland Kitchen Marital Status:   Intimate Partner Violence:   . Fear of Current or Ex-Partner:   . Emotionally Abused:   Marland Kitchen Physically Abused:   . Sexually Abused:       Review of Systems  Constitutional: Negative for chills, fatigue, fever and unexpected weight change.  Respiratory: Negative for cough, chest tightness, shortness of breath and wheezing.   Cardiovascular: Negative for chest pain and leg swelling.  Gastrointestinal: Negative for abdominal distention, constipation, diarrhea, nausea and vomiting.  Neurological: Negative for dizziness, weakness, light-headedness and headaches.  Hematological: Does not bruise/bleed easily.       Objective:    BP 131/77   Pulse 63   Temp 97.9 F (36.6 C) (Oral)   Wt 238 lb (108 kg)   LMP 02/19/2020   BMI 35.15 kg/m  Nursing note and vital signs reviewed.  Physical Exam Constitutional:      General: She is not in acute distress.    Appearance: She is well-developed.  Eyes:     Conjunctiva/sclera: Conjunctivae normal.  Cardiovascular:     Rate and Rhythm:  Normal rate and regular rhythm.     Heart sounds: Normal heart sounds. No murmur heard.  No friction rub. No gallop.   Pulmonary:     Effort: Pulmonary effort is normal. No respiratory distress.     Breath sounds: Normal breath sounds. No wheezing or rales.  Chest:     Chest wall: No tenderness.  Abdominal:     General: Bowel sounds are normal.     Palpations: Abdomen is soft.     Tenderness: There is no abdominal tenderness.  Musculoskeletal:     Cervical back: Neck supple.  Lymphadenopathy:     Cervical: No cervical adenopathy.  Skin:    General: Skin is  warm and dry.     Findings: No rash.  Neurological:     Mental Status: She is alert and oriented to person, place, and time.  Psychiatric:        Behavior: Behavior normal.        Thought Content: Thought content normal.        Judgment: Judgment normal.         Assessment & Plan:   Patient Active Problem List   Diagnosis Date Noted  . Anxiety and depression 04/27/2016  . Hepatitis C antibody test positive 04/27/2016  . Family history of heart disease 04/27/2016  . RLS (restless legs syndrome) 04/27/2016  . Overactive bladder 04/27/2016  . Current smoker 04/27/2016     Problem List Items Addressed This Visit      Other   Hepatitis C antibody test positive - Primary    Kathryn Reid is a 34 year old female with positive hepatitis C antibody testing and previously negative HCV RNA levels in 2017. Risk factors for acquiring hepatitis C include homemade tattoo, IV drug use, and sharing of toothbrushes/razors. Treatment nave and asymptomatic. Discussed the pathogenesis, transmission, prevention, risks if left untreated, and treatment options for hepatitis C. Check hepatitis C genotype and HCV viral load, HIV status, hepatitis B status, and liver function testing. Completed financial paperwork with pharmacy staff for medication assistance if needed. Plan for follow-up pending blood work results.      Relevant Orders    Hepatic function panel   Hepatitis C genotype   Hepatitis C RNA quantitative   Liver Fibrosis, FibroTest-ActiTest   Hepatitis B surface antibody,qualitative   Hepatitis B surface antigen   HIV Antibody (routine testing w rflx)       I have discontinued Kathryn Reid's clonazePAM, QUEtiapine, levonorgestrel, tolterodine, cyclobenzaprine, venlafaxine XR, ondansetron, permethrin, and mupirocin ointment. I am also having her maintain her oxybutynin and traZODone.   No orders of the defined types were placed in this encounter.    Follow-up: Pending blood work if necessary   Terri Piedra, MSN, [place] for [place] number: [phone]

## 2020-04-09 NOTE — Patient Instructions (Signed)
Nice to see you.  We will check your blood work today.  Plan for follow-up pending blood work results.  Limit acetaminophen (Tylenol) usage to no more than 2 grams (2,000 mg) per day.  Avoid alcohol.  Do not share toothbrushes or razors.  Practice safe sex to protect against transmission as well as sexually transmitted disease.    Hepatitis C Hepatitis C is a viral infection of the liver. It can lead to scarring of the liver (cirrhosis), liver failure, or liver cancer. Hepatitis C Iversen go undetected for months or years because people with the infection Kolbe not have symptoms, or they Ludvigsen have only mild symptoms. What are the causes? This condition is caused by the hepatitis C virus (HCV). The virus can spread from person to person (is contagious) through:  Blood.  Childbirth. A woman who has hepatitis C can pass it to her baby during birth.  Bodily fluids, such as breast milk, tears, semen, vaginal fluids, and saliva.  Blood transfusions or organ transplants done in the [place] before 1992.  What increases the risk? The following factors Hughson make you more likely to develop this condition:  Having contact with unclean (contaminated) needles or syringes. This Saari result from: ? Acupuncture. ? Tattoing. ? Body piercing. ? Injecting drugs.  Having unprotected sex with someone who is infected.  Needing treatment to filter your blood (kidney dialysis).  Having HIV (human immunodeficiency virus) or AIDS (acquired immunodeficiency syndrome).  Working in a job that involves contact with blood or bodily fluids, such as health care.  What are the signs or symptoms? Symptoms of this condition include:  Fatigue.  Loss of appetite.  Nausea.  Vomiting.  Abdominal pain.  Dark yellow urine.  Yellowish skin and eyes (jaundice).  Itchy skin.  Clay-colored bowel movements.  Joint pain.  Bleeding and bruising easily.  Fluid building up in your stomach  (ascites).  In some cases, you Bentson not have any symptoms. How is this diagnosed? This condition is diagnosed with:  Blood tests.  Other tests to check how well your liver is functioning. They Merriweather include: ? Magnetic resonance elastography (MRE). This imaging test uses MRIs and sound waves to measure liver stiffness. ? Transient elastography. This imaging test uses ultrasounds to measure liver stiffness. ? Liver biopsy. This test requires taking a small tissue sample from your liver to examine it under a microscope.  How is this treated? Your health care provider Waugh perform noninvasive tests or a liver biopsy to help decide the best course of treatment. Treatment Wholey include:  Antiviral medicines and other medicines.  Follow-up treatments every 6-12 months for infections or other liver conditions.  Receiving a donated liver (liver transplant).  Follow these instructions at home: Medicines  Take over-the-counter and prescription medicines only as told by your health care provider.  Take your antiviral medicine as told by your health care provider. Do not stop taking the antiviral even if you start to feel better.  Do not take any medicines unless approved by your health care provider, including over-the-counter medicines and birth control pills. Activity  Rest as needed.  Do not have sex unless approved by your health care provider.  Ask your health care provider when you Turton return to school or work. Eating and drinking  Eat a balanced diet with plenty of fruits and vegetables, whole grains, and lowfat (lean) meats or non-meat proteins (such as beans or tofu).  Drink enough fluids to keep your urine clear or pale  yellow.  Do not drink alcohol. General instructions  Do not share toothbrushes, nail clippers, or razors.  Wash your hands frequently with soap and water. If soap and water are not available, use hand sanitizer.  Cover any cuts or open sores on your skin to  prevent spreading the virus.  Keep all follow-up visits as told by your health care provider. This is important. You Livecchi need follow-up visits every 6-12 months. How is this prevented? There is no vaccine for hepatitis C. The only way to prevent the disease is to reduce the risk of exposure to the virus. Make sure you:  Wash your hands frequently with soap and water. If soap and water are not available, use hand sanitizer.  Do not share needles or syringes.  Practice safe sex and use condoms.  Avoid handling blood or bodily fluids without gloves or other protection.  Avoid getting tattoos or piercings in shops or other locations that are not clean.  Contact a health care provider if:  You have a fever.  You develop abdominal pain.  You pass dark urine.  You pass clay-colored stools.  You develop joint pain. Get help right away if:  You have increasing fatigue or weakness.  You lose your appetite.  You cannot eat or drink without vomiting.  You develop jaundice or your jaundice gets worse.  You bruise or bleed easily. Summary  Hepatitis C is a viral infection of the liver. It can lead to scarring of the liver (cirrhosis), liver failure, or liver cancer.  The hepatitis C virus (HCV) causes this condition. The virus can pass from person to person (is contagious).  You should not take any medicines unless approved by your health care provider. This includes over-the-counter medicines and birth control pills. This information is not intended to replace advice given to you by your health care provider. Make sure you discuss any questions you have with your health care provider. Document Released: 10/02/2000 Document Revised: 11/10/2016 Document Reviewed: 11/10/2016 Elsevier Interactive Patient Education  [employer].

## 2020-04-09 NOTE — Assessment & Plan Note (Signed)
Kathryn Reid is a 34 year old female with positive hepatitis C antibody testing and previously negative HCV RNA levels in 2017. Risk factors for acquiring hepatitis C include homemade tattoo, IV drug use, and sharing of toothbrushes/razors. Treatment nave and asymptomatic. Discussed the pathogenesis, transmission, prevention, risks if left untreated, and treatment options for hepatitis C. Check hepatitis C genotype and HCV viral load, HIV status, hepatitis B status, and liver function testing. Completed financial paperwork with pharmacy staff for medication assistance if needed. Plan for follow-up pending blood work results.

## 2020-04-09 NOTE — Telephone Encounter (Signed)
[facility] Patient Advocate Encounter  Met with patient and got all needed applications filled out and signed.  Will continue to follow to see if medication is needed.  Kathryn Reid. Nadara Mustard [place] Patient [facility] for Infectious Disease Phone: [phone] Fax:  [phone]

## 2020-04-17 LAB — LIVER FIBROSIS, FIBROTEST-ACTITEST
ALT: 79 U/L — ABNORMAL HIGH (ref 6–29)
Alpha-2-Macroglobulin: 149 mg/dL (ref 106–279)
Apolipoprotein A1: 132 mg/dL (ref 101–198)
Bilirubin: 0.4 mg/dL (ref 0.2–1.2)
Fibrosis Score: 0.07
GGT: 38 U/L (ref 3–50)
Haptoglobin: 156 mg/dL (ref 43–212)
Necroinflammat ACT Score: 0.38
Reference ID: 3451937

## 2020-04-17 LAB — HEPATIC FUNCTION PANEL
AG Ratio: 1.4 (calc) (ref 1.0–2.5)
ALT: 80 U/L — ABNORMAL HIGH (ref 6–29)
AST: 58 U/L — ABNORMAL HIGH (ref 10–30)
Albumin: 4.2 g/dL (ref 3.6–5.1)
Alkaline phosphatase (APISO): 57 U/L (ref 31–125)
Bilirubin, Direct: 0.1 mg/dL (ref 0.0–0.2)
Globulin: 2.9 g/dL (calc) (ref 1.9–3.7)
Indirect Bilirubin: 0.3 mg/dL (calc) (ref 0.2–1.2)
Total Bilirubin: 0.4 mg/dL (ref 0.2–1.2)
Total Protein: 7.1 g/dL (ref 6.1–8.1)

## 2020-04-17 LAB — HEPATITIS C RNA QUANTITATIVE
HCV Quantitative Log: 4.56 Log IU/mL — ABNORMAL HIGH
HCV RNA, PCR, QN: 36100 IU/mL — ABNORMAL HIGH

## 2020-04-17 LAB — HEPATITIS B SURFACE ANTIGEN: Hepatitis B Surface Ag: NONREACTIVE

## 2020-04-17 LAB — HIV ANTIBODY (ROUTINE TESTING W REFLEX): HIV 1&2 Ab, 4th Generation: NONREACTIVE

## 2020-04-17 LAB — HEPATITIS C GENOTYPE: HCV Genotype: 2

## 2020-04-17 LAB — HEPATITIS B SURFACE ANTIBODY,QUALITATIVE: Hep B S Ab: REACTIVE — AB

## 2020-05-08 ENCOUNTER — Telehealth: Payer: Self-pay | Admitting: Family

## 2020-05-08 MED ORDER — MAVYRET 100-40 MG PO TABS: 3.0000 | ORAL_TABLET | Freq: Every day | ORAL | 1 refills | Status: DC

## 2020-05-08 NOTE — Telephone Encounter (Signed)
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 Will plan for 8 weeks of Mavyret.

## 2020-05-13 ENCOUNTER — Other Ambulatory Visit: Payer: Self-pay | Admitting: Pharmacist

## 2020-05-13 ENCOUNTER — Telehealth: Payer: Self-pay | Admitting: Pharmacy Technician

## 2020-05-13 DIAGNOSIS — R768 Other specified abnormal immunological findings in serum: Secondary | ICD-10-CM

## 2020-05-13 MED ORDER — MAVYRET 100-40 MG PO TABS: 3.0000 | ORAL_TABLET | Freq: Every day | ORAL | 1 refills | Status: AC

## 2020-05-13 NOTE — Telephone Encounter (Signed)
[facility] Patient Advocate Encounter  Completed and sent MYABBVIE application for Mavyret for this patient who is uninsured.    Patient is approved through 11/13/2020.  Their pharmacy will reach out to schedule delivery and patient prefers having it shipped to her home.  We will continue to follow.  Netty Starring. Dimas Aguas CPhT Specialty Pharmacy Patient [facility] for Infectious Disease Phone: [phone] Fax:  [phone]

## 2020-05-13 NOTE — Telephone Encounter (Signed)
[facility] Patient Advocate Encounter  Completed and sent MYABBVIE application for Mavyret for this patient who is uninsured.    Patient assistance phone number for follow up is [phone].   This encounter will be updated until final determination.   Dewitte Vannice E. Merleen Picazo, CPhT Specialty Pharmacy Patient [facility] for Infectious Disease Phone: [phone] Fax:  [phone]  

## 2020-05-13 NOTE — Progress Notes (Signed)
Printing Rx for patient assistance. 

## 2020-05-15 ENCOUNTER — Telehealth: Payer: Self-pay | Admitting: Pharmacy Technician

## 2020-05-15 NOTE — Telephone Encounter (Signed)
[facility] Patient Advocate Encounter  Valla Leaver has not yet spoken with patient to set up delivery.  We will continue to follow.  Netty Starring. Dimas Aguas CPhT Specialty Pharmacy Patient [facility] for Infectious Disease Phone: [phone] Fax:  [phone]

## 2020-05-28 NOTE — Telephone Encounter (Signed)
Spoke with patient and she states she does not know when medication is to arrive.  I gave her the phone number to call.  Netty Starring. Dimas Aguas CPhT Specialty Pharmacy Patient [facility] for Infectious Disease Phone: [phone] Fax:  [phone]

## 2020-06-04 ENCOUNTER — Encounter: Payer: Self-pay | Admitting: Pharmacy Technician

## 2020-06-04 ENCOUNTER — Telehealth: Payer: Self-pay | Admitting: Pharmacist

## 2020-06-04 NOTE — Telephone Encounter (Signed)
We have been trying to reach patient regarding her Hepatitis C medication, Mavyret without any luck. We finally got in touch with her today. She started taking Mavyret 4 days ago but has been taking it incorrectly. She has been taking it three times per day instead of once per day.  Counseled her on how to take it properly and she will start tomorrow.  I told her to call me with any issues. All questions answered.

## 2020-07-15 ENCOUNTER — Ambulatory Visit: Payer: Self-pay | Admitting: Pharmacist

## 2020-07-29 ENCOUNTER — Other Ambulatory Visit: Payer: Self-pay

## 2020-07-29 ENCOUNTER — Ambulatory Visit (INDEPENDENT_AMBULATORY_CARE_PROVIDER_SITE_OTHER): Payer: Self-pay | Admitting: Pharmacist

## 2020-07-29 DIAGNOSIS — R768 Other specified abnormal immunological findings in serum: Secondary | ICD-10-CM

## 2020-07-29 NOTE — Progress Notes (Signed)
HPI: Kathryn Reid is a 34 y.o. female who presents to the [facility] pharmacy clinic for Hepatitis C follow-up.  Medication: Mavyret x8 weeks  Start Date: 05/31/2020  Hepatitis C Genotype: 2  Fibrosis Score: F0  Hepatitis C RNA: 36,100 on 04/09/2020  Patient Active Problem List   Diagnosis Date Noted   Anxiety and depression 04/27/2016   Hepatitis C antibody test positive 04/27/2016   Family history of heart disease 04/27/2016   RLS (restless legs syndrome) 04/27/2016   Overactive bladder 04/27/2016   Current smoker 04/27/2016    Patient's Medications  New Prescriptions   No medications on file  Previous Medications   GLECAPREVIR-PIBRENTASVIR (MAVYRET) 100-40 MG TABS    Take 3 tablets by mouth daily with breakfast.   OXYBUTYNIN (DITROPAN) 5 MG TABLET    Take 1 tablet by mouth daily.   TRAZODONE (DESYREL) 150 MG TABLET    Take 150 mg by mouth at bedtime.  Modified Medications   No medications on file  Discontinued Medications   No medications on file    Allergies: Allergies  Allergen Reactions   Other Anaphylaxis    Bee stings   Bee Venom     Past Medical History: Past Medical History:  Diagnosis Date   Anxiety    Depression    Hep C w/ coma, chronic (HCC)     Social History: Social History   Socioeconomic History   Marital status: Divorced    Spouse name: Not on file   Number of children: Not on file   Years of education: Not on file   Highest education level: Not on file  Occupational History   Not on file  Tobacco Use   Smoking status: Current Every Day Smoker    Packs/day: 1.00   Smokeless tobacco: Former Forensic psychologist Use: Every day  Substance and Sexual Activity   Alcohol use: No   Drug use: Not Currently   Sexual activity: Not on file  Other Topics Concern   Not on file  Social History Narrative   Not on file   Social Determinants of Health   Financial Resource Strain:    Difficulty of Paying  Living Expenses: Not on file  Food Insecurity:    Worried About Programme researcher, broadcasting/film/video in the Last Year: Not on file   [employer] of Food in the Last Year: Not on file  Transportation Needs:    Lack of Transportation (Medical): Not on file   Lack of Transportation (Non-Medical): Not on file  Physical Activity:    Days of Exercise per Week: Not on file   Minutes of Exercise per Session: Not on file  Stress:    Feeling of Stress : Not on file  Social Connections:    Frequency of Communication with Friends and Family: Not on file   Frequency of Social Gatherings with Friends and Family: Not on file   Attends Religious Services: Not on file   Active Member of Clubs or Organizations: Not on file   Attends Banker Meetings: Not on file   Marital Status: Not on file    Labs: Hepatitis C Lab Results  Component Value Date   HCVGENOTYPE 2 04/09/2020   HCVRNAPCRQN 36,100 (H) 04/09/2020   HCVRNAPCRQN <15 04/27/2016   FIBROSTAGE F0 04/09/2020   Hepatitis B Lab Results  Component Value Date   HEPBSAB REACTIVE (A) 04/09/2020   HEPBSAG NON-REACTIVE 04/09/2020   Hepatitis A No results found for:  HAV HIV Lab Results  Component Value Date   HIV NON-REACTIVE 04/09/2020   Lab Results  Component Value Date   CREATININE 0.69 04/27/2016   Lab Results  Component Value Date   AST 58 (H) 04/09/2020   ALT 80 (H) 04/09/2020   ALT 79 (H) 04/09/2020    Assessment: Kathryn Reid presents today for her end of treatment follow-up visit. She reports missing two days of Mavyret within the past week and has 3 days left to complete. She was diagonsed with hepatitis C in 2017 (genotype 2) with an initial RNA VL of 36,100, fibrosis score of F0 and no history of prior treatment. Risk factors include IVDU, homemade tattoos, and shared needles/razors. At beginning of treatment, she was taking Mavyret one tablet three times daily instead of 3 tablets once daily, now reports taking it as  prescribed. She reports headaches at the beginning of therapy which has since resolved. Will check HCV viral load today.  Plan: - Complete last 3 days of Mavyret  - Labs: Hep C viral load - F/u with Cassie in 12 weeks for cure visit on 10/21/2020 at 9:15AM   Fabio Neighbors, PharmD PGY2 Ambulatory Care Resident [facility]   Pharmacy

## 2020-08-01 LAB — HEPATITIS C RNA QUANTITATIVE
HCV RNA, PCR, QN (Log): <1.18 log IU/mL
HCV RNA, PCR, QN: <15 IU/mL

## 2020-08-15 ENCOUNTER — Telehealth: Payer: Self-pay

## 2020-08-15 NOTE — Telephone Encounter (Signed)
Patient left VM requesting lab results; attempted to call patient back to discuss labs unable to leave VM due to mailbox not being set up.   Rayquon Uselman Loyola Mast, RN

## 2020-08-15 NOTE — Telephone Encounter (Signed)
Relayed test results to patient; advised to continue taking medication as prescribed and attend follow up appointment in January.   Jamarcus Laduke Loyola Mast, RN

## 2020-09-25 ENCOUNTER — Other Ambulatory Visit: Payer: Self-pay | Admitting: Pharmacist

## 2020-09-25 DIAGNOSIS — R768 Other specified abnormal immunological findings in serum: Secondary | ICD-10-CM

## 2020-10-21 ENCOUNTER — Ambulatory Visit: Payer: Self-pay | Admitting: Pharmacist

## 2020-10-21 ENCOUNTER — Other Ambulatory Visit: Payer: Self-pay

## 2022-02-05 ENCOUNTER — Emergency Department (HOSPITAL_COMMUNITY)
Admission: EM | Admit: 2022-02-05 | Discharge: 2022-02-05 | Disposition: A | Discharge status: Home / Self Care | Payer: BLUE CROSS/BLUE SHIELD | Attending: Emergency Medicine | Admitting: Emergency Medicine

## 2022-02-05 ENCOUNTER — Other Ambulatory Visit: Payer: Self-pay

## 2022-02-05 ENCOUNTER — Encounter (HOSPITAL_COMMUNITY): Payer: Self-pay

## 2022-02-05 DIAGNOSIS — X58XXXA Exposure to other specified factors, initial encounter: Secondary | ICD-10-CM | POA: Diagnosis not present

## 2022-02-05 DIAGNOSIS — M795 Residual foreign body in soft tissue: Secondary | ICD-10-CM

## 2022-02-05 DIAGNOSIS — S00551A Superficial foreign body of lip, initial encounter: Secondary | ICD-10-CM | POA: Insufficient documentation

## 2022-02-05 DIAGNOSIS — S0993XA Unspecified injury of face, initial encounter: Secondary | ICD-10-CM | POA: Diagnosis present

## 2022-02-05 MED ORDER — LIDOCAINE HCL (PF) 1 % IJ SOLN
5.0000 mL | Freq: Once | INTRAMUSCULAR | Status: DC
  Filled 2022-02-05: qty 30

## 2022-02-05 NOTE — Discharge Instructions (Signed)
Please continue to monitor your symptoms closely.  If you develop any new or worsening symptoms please come back to the emergency department. 

## 2022-02-05 NOTE — ED Provider Notes (Signed)
?[facility]-EMERGENCY DEPT ?Provider Note ? ? ?CSN: [id number] ?Arrival date & time: 02/05/22  1908 ? ?  ? ?History ? ?Chief Complaint  ?Patient presents with  ? piercing issue   ? ? ?Kathryn Reid is a 36 y.o. female. ? ?HPI ?Patient is a 36 year old female who presents to the emergency department due to a lip piercing malfunction.  She states that she has had a lip piercing for a long time to the right lower lip but about 2 days ago the backing became stuck in the tissue of her lip and now she cannot remove the ring.  Patient is requesting to have it removed.  No other somatic complaints. ?  ? ?Home Medications ?Prior to Admission medications   ?Medication Sig Start Date End Date Taking? Authorizing Provider  ?Glecaprevir-Pibrentasvir (MAVYRET) 100-40 MG TABS Take 3 tablets by mouth daily with breakfast. 05/13/20   Blanchard Kelch, NP  ?oxybutynin (DITROPAN) 5 MG tablet Take 1 tablet by mouth daily. 03/27/16   [provider]  ?traZODone (DESYREL) 150 MG tablet Take 150 mg by mouth at bedtime.    [provider]  ?   ? ?Allergies    ?Other and Bee venom   ? ?Review of Systems   ?Review of Systems  ?Constitutional:  Negative for fever.  ?HENT:  Positive for facial swelling. Negative for trouble swallowing.   ?Musculoskeletal:  Positive for myalgias.  ? ?Physical Exam ?Updated Vital Signs ?BP 134/88 (BP Location: Left Arm)   Pulse (!) 56   Temp 98.2 ?F (36.8 ?C) (Oral)   Resp 18   Ht 5\' 9"  (1.753 m)   Wt 108.9 kg   SpO2 100%   BMI 35.44 kg/m?  ?Physical Exam ?Vitals and nursing note reviewed.  ?Constitutional:   ?   General: She is not in acute distress. ?   Appearance: She is well-developed.  ?HENT:  ?   Head: Normocephalic and atraumatic.  ?   Right Ear: External ear normal.  ?   Left Ear: External ear normal.  ?Eyes:  ?   General: No scleral icterus.    ?   Right eye: No discharge.     ?   Left eye: No discharge.  ?   Conjunctiva/sclera: Conjunctivae normal.  ?Neck:  ?    Trachea: No tracheal deviation.  ?Cardiovascular:  ?   Rate and Rhythm: Normal rate.  ?Pulmonary:  ?   Effort: Pulmonary effort is normal. No respiratory distress.  ?   Breath sounds: No stridor.  ?Abdominal:  ?   General: There is no distension.  ?Musculoskeletal:     ?   General: No swelling or deformity.  ?   Cervical back: Neck supple.  ?Skin: ?   General: Skin is warm and dry.  ?   Findings: No rash.  ?Neurological:  ?   Mental Status: She is alert.  ?   Cranial Nerves: Cranial nerve deficit: no gross deficits.  ? ?ED Results / Procedures / Treatments   ?Labs ?(all labs ordered are listed, but only abnormal results are displayed) ?Labs Reviewed - No data to display ? ?EKG ?None ? ?Radiology ?No results found. ? ?Procedures ? Foreign Body Removal ? ?Date/Time: 02/05/2022 8:26 PM ?Performed by: 02/07/2022, PA-C ?Authorized by: Placido Sou, PA-C  ?Consent: Verbal consent obtained. ?Consent given by: patient ?Patient understanding: patient states understanding of the procedure being performed ?Patient consent: the patient's understanding of the procedure matches consent given ?Procedure consent: procedure consent  matches procedure scheduled ?Relevant documents: relevant documents present and verified ?Test results: test results available and properly labeled ?Site marked: the operative site was marked ?Imaging studies: imaging studies available ?Patient identity confirmed: verbally with patient ?Intake: lip. ?Anesthesia: local infiltration ? ?Anesthesia: ?Local Anesthetic: lidocaine 1% without epinephrine ?Anesthetic total: 1 mL ? ?Sedation: ?Patient sedated: no ? ?Patient restrained: no ?Patient cooperative: yes ?Complexity: simple ?1 objects recovered. ?Objects recovered: lip ring ?Post-procedure assessment: foreign body removed ?Patient tolerance: patient tolerated the procedure well with no immediate complications ?  ?Medications Ordered in ED ?Medications  ?lidocaine (PF) (XYLOCAINE) 1 % injection  5 mL (has no administration in time range)  ? ? ?ED Course/ Medical Decision Making/ A&P ?  ?                        ?Medical Decision Making ?Risk ?Prescription drug management. ? ?Pt is a 36 y.o. female who presents to the emergency department for removal of an earring to the right lower lip.  States that its been in place for more than 1 year and about 2 days ago the backing lodged into her skin and she was unable to remove the lip ring. ? ?Site was cleaned and injected with 1% lidocaine.  The cap on the left ring was removed with hemostats and the lip ring was successfully removed without complication.  Patient tolerated the procedure well. ? ?Patient appears stable for discharge at this time and she is agreeable.  We discussed return precautions.  Her questions were answered and she was amicable at the time of discharge. ? ?Note: Portions of this report Aure have been transcribed using voice recognition software. Every effort was made to ensure accuracy; however, inadvertent computerized transcription errors Klich be present.  ? ?Final Clinical Impression(s) / ED Diagnoses ?Final diagnoses:  ?Foreign body (FB) in soft tissue  ? ?Rx / DC Orders ?ED Discharge Orders   ? ? None  ? ?  ? ? ?  ?Placido Sou, PA-C ?02/05/22 2028 ? ?  ?Pricilla Loveless, MD ?02/05/22 2311 ? ?

## 2022-02-05 NOTE — ED Triage Notes (Signed)
Pt has a piercing to her right lip that has become stuck in her lip x 2 days. Pt wants the piercing removed.  ?

## 2022-07-18 ENCOUNTER — Encounter (HOSPITAL_BASED_OUTPATIENT_CLINIC_OR_DEPARTMENT_OTHER): Payer: Self-pay | Admitting: Emergency Medicine

## 2022-07-18 ENCOUNTER — Emergency Department (HOSPITAL_BASED_OUTPATIENT_CLINIC_OR_DEPARTMENT_OTHER)
Admission: EM | Admit: 2022-07-18 | Discharge: 2022-07-18 | Disposition: A | Discharge status: Home / Self Care | Payer: BLUE CROSS/BLUE SHIELD | Attending: Emergency Medicine | Admitting: Emergency Medicine

## 2022-07-18 ENCOUNTER — Other Ambulatory Visit: Payer: Self-pay

## 2022-07-18 DIAGNOSIS — R519 Headache, unspecified: Secondary | ICD-10-CM | POA: Diagnosis present

## 2022-07-18 DIAGNOSIS — R1110 Vomiting, unspecified: Secondary | ICD-10-CM | POA: Diagnosis not present

## 2022-07-18 DIAGNOSIS — R059 Cough, unspecified: Secondary | ICD-10-CM | POA: Insufficient documentation

## 2022-07-18 DIAGNOSIS — R509 Fever, unspecified: Secondary | ICD-10-CM | POA: Diagnosis not present

## 2022-07-18 DIAGNOSIS — Z8616 Personal history of COVID-19: Secondary | ICD-10-CM | POA: Insufficient documentation

## 2022-07-18 MED ORDER — DIPHENHYDRAMINE HCL 50 MG/ML IJ SOLN
25.0000 mg | Freq: Once | INTRAMUSCULAR | Status: AC
  Administered 2022-07-18: 25 mg via INTRAVENOUS
  Filled 2022-07-18: qty 1

## 2022-07-18 MED ORDER — METOCLOPRAMIDE HCL 5 MG/ML IJ SOLN
10.0000 mg | Freq: Once | INTRAMUSCULAR | Status: AC
  Administered 2022-07-18: 10 mg via INTRAVENOUS
  Filled 2022-07-18: qty 2

## 2022-07-18 MED ORDER — DEXAMETHASONE SODIUM PHOSPHATE 10 MG/ML IJ SOLN
10.0000 mg | Freq: Once | INTRAMUSCULAR | Status: AC
  Administered 2022-07-18: 10 mg via INTRAVENOUS
  Filled 2022-07-18: qty 1

## 2022-07-18 MED ORDER — SODIUM CHLORIDE 0.9 % IV BOLUS
1000.0000 mL | Freq: Once | INTRAVENOUS | Status: AC
  Administered 2022-07-18: 1000 mL via INTRAVENOUS

## 2022-07-18 NOTE — Discharge Instructions (Signed)
Please read and follow all provided instructions.  Your diagnoses today include:  1. Acute nonintractable headache, unspecified headache type     Tests performed today include: CT of your head which was normal and did not show any serious cause of your headache Vital signs. See below for your results today.   Medications:  In the Emergency Department you received: Reglan - antinausea/headache medication Benadryl - antihistamine to counteract potential side effects of reglan Decadron -steroid medicine which can help headache and Runyon help prevent recurrence  Take any prescribed medications only as directed.  Additional information:  Follow any educational materials contained in this packet.  You are having a headache. No specific cause was found today for your headache. It Hinderman have been a migraine or other cause of headache. Stress, anxiety, fatigue, and depression are common triggers for headaches.   Your headache today does not appear to be life-threatening or require hospitalization, but often the exact cause of headaches is not determined in the emergency department. Therefore, follow-up with your doctor is very important to find out what Mellor have caused your headache and whether or not you need any further diagnostic testing or treatment.   Sometimes headaches can appear benign (not harmful), but then more serious symptoms can develop which should prompt an immediate re-evaluation by your doctor or the emergency department.  BE VERY CAREFUL not to take multiple medicines containing Tylenol (also called acetaminophen). Doing so can lead to an overdose which can damage your liver and cause liver failure and possibly death.   Follow-up instructions: Please follow-up with your primary care provider in the next 3 days for further evaluation of your symptoms.   Return instructions:  Please return to the Emergency Department if you experience worsening symptoms. Return if the  medications do not resolve your headache, if it recurs, or if you have multiple episodes of vomiting or cannot keep down fluids. Return if you have a change from the usual headache. RETURN IMMEDIATELY IF you: Develop a sudden, severe headache Develop confusion or become poorly responsive or faint Develop a fever above 100.37F or problem breathing Have a change in speech, vision, swallowing, or understanding Develop new weakness, numbness, tingling, incoordination in your arms or legs Have a seizure Please return if you have any other emergent concerns.  Additional Information:  Your vital signs today were: BP 119/84 (BP Location: Left Arm)   Pulse 79   Temp 98 F (36.7 C) (Oral)   Resp 17   Ht 5\' 9"  (1.753 m)   Wt 74.8 kg   LMP 07/07/2022   SpO2 100%   BMI 24.37 kg/m  If your blood pressure (BP) was elevated above 135/85 this visit, please have this repeated by your doctor within one month. --------------

## 2022-07-18 NOTE — ED Provider Notes (Signed)
[facility] EMERGENCY DEPARTMENT Provider Note   CSN: [id number] Arrival date & time: 07/18/22  1132     History  Chief Complaint  Patient presents with   Headache    Kathryn Reid is a 36 y.o. female.  Patient presents the emergency department for evaluation of headache.  She was diagnosed with COVID 4 days ago.  She has had symptoms for about a week.  This has included headaches, fever, occasional vomiting, cough.  She was prescribed Phenergan and vomiting has improved.  However she continues to have generalized headache that has persisted.  It radiates to her teeth.  No facial swelling.  She has tried Excedrin without improvement.  She does get headaches from time to time. Patient denies signs of stroke including: facial droop, slurred speech, aphasia, weakness/numbness in extremities, imbalance/trouble walking.  No chest pain or shortness of breath.  No neck pain or confusion.      Home Medications Prior to Admission medications   Medication Sig Start Date End Date Taking? Authorizing Provider  Glecaprevir-Pibrentasvir (MAVYRET) 100-40 MG TABS Take 3 tablets by mouth daily with breakfast. 05/13/20   Wildwood Callas, NP  oxybutynin (DITROPAN) 5 MG tablet Take 1 tablet by mouth daily. 03/27/16   [provider]  traZODone (DESYREL) 150 MG tablet Take 150 mg by mouth at bedtime.    [provider]      Allergies    Other and Bee venom    Review of Systems   Review of Systems  Physical Exam Updated Vital Signs BP 119/84 (BP Location: Left Arm)   Pulse 79   Temp 98 F (36.7 C) (Oral)   Resp 17   Ht 5\' 9"  (1.753 m)   Wt 74.8 kg   LMP 07/07/2022   SpO2 100%   BMI 24.37 kg/m   Physical Exam Vitals and nursing note reviewed.  Constitutional:      Appearance: She is well-developed.  HENT:     Head: Normocephalic and atraumatic.     Right Ear: Tympanic membrane, ear canal and external ear normal.     Left Ear: Tympanic membrane, ear canal  and external ear normal.     Ears:     Comments: Chronic scarring TMs    Nose: Nose normal.     Mouth/Throat:     Pharynx: Uvula midline.  Eyes:     General: Lids are normal.     Extraocular Movements:     Right eye: No nystagmus.     Left eye: No nystagmus.     Conjunctiva/sclera: Conjunctivae normal.     Pupils: Pupils are equal, round, and reactive to light.  Cardiovascular:     Rate and Rhythm: Normal rate and regular rhythm.  Pulmonary:     Effort: Pulmonary effort is normal.     Breath sounds: Normal breath sounds.  Abdominal:     Palpations: Abdomen is soft.     Tenderness: There is no abdominal tenderness.  Musculoskeletal:     Cervical back: Normal range of motion and neck supple. No tenderness or bony tenderness.  Skin:    General: Skin is warm and dry.  Neurological:     Mental Status: She is alert and oriented to person, place, and time.     GCS: GCS eye subscore is 4. GCS verbal subscore is 5. GCS motor subscore is 6.     Cranial Nerves: No cranial nerve deficit.     Sensory: No sensory deficit.  Motor: No weakness.     Coordination: Coordination normal.     Gait: Gait normal.     Comments: Upper extremity myotomes tested bilaterally:  C5 Shoulder abduction 5/5 C6 Elbow flexion/wrist extension 5/5 C7 Elbow extension 5/5 C8 Finger flexion 5/5 T1 Finger abduction 5/5  Lower extremity myotomes tested bilaterally: L2 Hip flexion 5/5 L3 Knee extension 5/5 L4 Ankle dorsiflexion 5/5 S1 Ankle plantar flexion 5/5     ED Results / Procedures / Treatments   Labs (all labs ordered are listed, but only abnormal results are displayed) Labs Reviewed - No data to display  EKG None  Radiology No results found.  Procedures Procedures    Medications Ordered in ED Medications  metoCLOPramide (REGLAN) injection 10 mg (has no administration in time range)  diphenhydrAMINE (BENADRYL) injection 25 mg (has no administration in time range)  dexamethasone  (DECADRON) injection 10 mg (has no administration in time range)  sodium chloride 0.9 % bolus 1,000 mL (has no administration in time range)    ED Course/ Medical Decision Making/ A&P    Patient seen and examined. History obtained directly from patient.   Labs/EKG: None ordered  Imaging: None ordered  Medications/Fluids: IV Reglan, IV Benadryl, IV Decadron, fluid bolus  Most recent vital signs reviewed and are as follows: BP 119/84 (BP Location: Left Arm)   Pulse 79   Temp 98 F (36.7 C) (Oral)   Resp 17   Ht 5\' 9"  (1.753 m)   Wt 74.8 kg   LMP 07/07/2022   SpO2 100%   BMI 24.37 kg/m   Initial impression: Headache, recent COVID infection  2:51 PM Reassessment performed. Patient appears more comfortable.  States that she is feeling better.  Requesting discharge to home.  Plan: Discharge to home.   Prescriptions written for: None  Other home care instructions discussed: OTC meds, rest, avoidance of triggers  ED return instructions discussed: Patient counseled to return if they have weakness in their arms or legs, slurred speech, trouble walking or talking, confusion, trouble with their balance, or if they have any other concerns. Patient verbalizes understanding and agrees with plan.   Follow-up instructions discussed: Patient encouraged to follow-up with their PCP in 3 days.                            Medical Decision Making Risk Prescription drug management.   In regards to the patient's headache, critical differentials were considered including subarachnoid hemorrhage, intracerebral hemorrhage, epidural/subdural hematoma, pituitary apoplexy, vertebral/carotid artery dissection, giant cell arteritis, central venous thrombosis, reversible cerebral vasoconstriction, acute angle closure glaucoma, idiopathic intracranial hypertension, bacterial meningitis, viral encephalitis, carbon monoxide poisoning, posterior reversible encephalopathy syndrome, pre-eclampsia.   Reg  flag symptoms related to these causes were considered including systemic symptoms (fever, weight loss), neurologic symptoms (confusion, mental status change, vision change, associated seizure), acute or sudden "thunderclap" onset, patient age 86 or older with new or progressive headache, patient of any age with first headache or change in headache pattern, pregnant or postpartum status, history of HIV or other immunocompromise, history of cancer, headache occurring with exertion, associated neck or shoulder pain, associated traumatic injury, concurrent use of anticoagulation, family history of spontaneous SAH, and concurrent drug use.    Other benign, more common causes of headache were considered including migraine, tension-type headache, cluster headache, referred pain from other cause such as sinus infection, dental pain, trigeminal neuralgia.   On exam, patient has a reassuring neuro exam  including baseline mental status, no significant neck pain or meningeal signs, no signs of severe infection or fever.   The patient's vital signs, pertinent lab work and imaging were reviewed and interpreted as discussed in the ED course. Hospitalization was considered for further testing, treatments, or serial exams/observation. However as patient is well-appearing, has a stable exam over the course of their evaluation, and reassuring studies today, I do not feel that they warrant admission at this time. This plan was discussed with the patient who verbalizes agreement and comfort with this plan and seems reliable and able to return to the Emergency Department with worsening or changing symptoms.         Final Clinical Impression(s) / ED Diagnoses Final diagnoses:  Acute nonintractable headache, unspecified headache type    Rx / DC Orders ED Discharge Orders     None         Carlisle Cater, PA-C 07/18/22 [place], DO 07/18/22 1519

## 2022-07-18 NOTE — ED Triage Notes (Signed)
Patient c/o headache and pain to right jaw onset last night.

## 2023-11-07 ENCOUNTER — Emergency Department (HOSPITAL_BASED_OUTPATIENT_CLINIC_OR_DEPARTMENT_OTHER)
Admission: EM | Admit: 2023-11-07 | Discharge: 2023-11-07 | Disposition: A | Discharge status: Home / Self Care | Payer: BLUE CROSS/BLUE SHIELD | Attending: Emergency Medicine | Admitting: Emergency Medicine

## 2023-11-07 ENCOUNTER — Encounter (HOSPITAL_BASED_OUTPATIENT_CLINIC_OR_DEPARTMENT_OTHER): Payer: Self-pay

## 2023-11-07 ENCOUNTER — Other Ambulatory Visit: Payer: Self-pay

## 2023-11-07 DIAGNOSIS — R519 Headache, unspecified: Secondary | ICD-10-CM | POA: Diagnosis present

## 2023-11-07 DIAGNOSIS — G43809 Other migraine, not intractable, without status migrainosus: Secondary | ICD-10-CM | POA: Diagnosis not present

## 2023-11-07 DIAGNOSIS — F17210 Nicotine dependence, cigarettes, uncomplicated: Secondary | ICD-10-CM | POA: Diagnosis not present

## 2023-11-07 MED ORDER — DIPHENHYDRAMINE HCL 50 MG/ML IJ SOLN
25.0000 mg | Freq: Once | INTRAMUSCULAR | Status: AC
  Administered 2023-11-07: 25 mg via INTRAVENOUS
  Filled 2023-11-07: qty 1

## 2023-11-07 MED ORDER — SODIUM CHLORIDE 0.9 % IV BOLUS
500.0000 mL | Freq: Once | INTRAVENOUS | Status: AC
  Administered 2023-11-07: 500 mL via INTRAVENOUS

## 2023-11-07 MED ORDER — SUMATRIPTAN SUCCINATE 100 MG PO TABS: 100.0000 mg | ORAL_TABLET | ORAL | 0 refills | Status: AC | PRN

## 2023-11-07 MED ORDER — DEXAMETHASONE SODIUM PHOSPHATE 10 MG/ML IJ SOLN
10.0000 mg | Freq: Once | INTRAMUSCULAR | Status: AC
  Administered 2023-11-07: 10 mg via INTRAVENOUS
  Filled 2023-11-07: qty 1

## 2023-11-07 MED ORDER — METOCLOPRAMIDE HCL 5 MG/ML IJ SOLN
10.0000 mg | Freq: Once | INTRAMUSCULAR | Status: AC
  Administered 2023-11-07: 10 mg via INTRAVENOUS
  Filled 2023-11-07: qty 2

## 2023-11-07 MED ORDER — KETOROLAC TROMETHAMINE 15 MG/ML IJ SOLN
15.0000 mg | Freq: Once | INTRAMUSCULAR | Status: AC
  Administered 2023-11-07: 15 mg via INTRAVENOUS
  Filled 2023-11-07: qty 1

## 2023-11-07 NOTE — ED Provider Notes (Signed)
[place] [facility] Provider Note   CSN: [id number] Arrival date & time: 11/07/23  1025     History  Chief Complaint  Patient presents with   Migraine    Kathryn Reid is a 38 y.o. female.   Migraine   38 year old female presents emergency department with a headache.  Headache intermittently present over the past week or so.  Patient reports history of migraines and states this feels similar.  States that current headache began after receiving Sublocade injection 10/20/23.  Has been trying Excedrin as well as Tylenol at home without significant improvement prompting visit to the emergency department.  Reports light sensitivity but without visual disturbance, gait abnormality, slurred speech, facial droop, weakness or sensory deficits in upper lower extremities.  States that pain is located on the left side of her head without radiation.  States it feels "the exact same" as prior migraine she has had in the past.  States that migraines in the past have lasted several days sometimes exceeding 2 weeks.  Was following with neurology and was on Imitrex for treatment of headaches in the past.  Past medical history significant for hepatitis C, depression, anxiety, restless leg, overactive bladder  Home Medications Prior to Admission medications   Medication Sig Start Date End Date Taking? Authorizing Provider  SUMAtriptan (IMITREX) 100 MG tablet Take 1 tablet (100 mg total) by mouth every 2 (two) hours as needed for migraine. [place]e repeat in 2 hours if headache persists or recurs. Do not exceed 2 doses in 24 hours 11/07/23  Yes Peter Garter, PA  Glecaprevir-Pibrentasvir (MAVYRET) 100-40 MG TABS Take 3 tablets by mouth daily with breakfast. 05/13/20   Blanchard Kelch, NP  oxybutynin (DITROPAN) 5 MG tablet Take 1 tablet by mouth daily. 03/27/16   [provider]  traZODone (DESYREL) 150 MG tablet Take 150 mg by mouth at bedtime.    [provider]      Allergies    Other and Bee venom    Review of Systems   Review of Systems  All other systems reviewed and are negative.   Physical Exam Updated Vital Signs BP 112/74 (BP Location: Left Arm)   Pulse 74   Temp 97.9 F (36.6 C)   Resp 18   Ht 5\' 9"  (1.753 m)   Wt 92.5 kg   SpO2 99%   BMI 30.13 kg/m  Physical Exam Vitals and nursing note reviewed.  Constitutional:      General: She is not in acute distress.    Appearance: She is well-developed.  HENT:     Head: Normocephalic and atraumatic.     Comments: No temporal tenderness bilaterally. Eyes:     Conjunctiva/sclera: Conjunctivae normal.  Cardiovascular:     Rate and Rhythm: Normal rate and regular rhythm.     Heart sounds: No murmur heard. Pulmonary:     Effort: Pulmonary effort is normal. No respiratory distress.     Breath sounds: Normal breath sounds.  Abdominal:     Palpations: Abdomen is soft.     Tenderness: There is no abdominal tenderness.  Musculoskeletal:        General: No swelling.     Cervical back: Neck supple.  Skin:    General: Skin is warm and dry.     Capillary Refill: Capillary refill takes less than 2 seconds.  Neurological:     Mental Status: She is alert.     Comments: Alert and oriented to  self, place, time and event.   Speech is fluent, clear without dysarthria or dysphasia.   Strength 5/5 in upper/lower extremities   Sensation intact in upper/lower extremities   Normal gait.  CN I not tested  CN II not tested CN III, IV, VI PERRLA and EOMs intact bilaterally  CN V Intact sensation to sharp and light touch to the face  CN VII facial movements symmetric  CN VIII not tested  CN IX, X no uvula deviation, symmetric rise of soft palate  CN XI 5/5 SCM and trapezius strength bilaterally  CN XII Midline tongue protrusion, symmetric L/R movements     Psychiatric:        Mood and Affect: Mood normal.     ED Results / Procedures / Treatments   Labs (all labs  ordered are listed, but only abnormal results are displayed) Labs Reviewed - No data to display  EKG None  Radiology No results found.  Procedures Procedures    Medications Ordered in ED Medications  metoCLOPramide (REGLAN) injection 10 mg (10 mg Intravenous Given 11/07/23 1134)  dexamethasone (DECADRON) injection 10 mg (10 mg Intravenous Given 11/07/23 1133)  diphenhydrAMINE (BENADRYL) injection 25 mg (25 mg Intravenous Given 11/07/23 1135)  ketorolac (TORADOL) 15 MG/ML injection 15 mg (15 mg Intravenous Given 11/07/23 1134)  sodium chloride 0.9 % bolus 500 mL (0 mLs Intravenous Stopped 11/07/23 1238)    ED Course/ Medical Decision Making/ A&P                                 Medical Decision Making Risk Prescription drug management.   This patient presents to the ED for concern of headache, this involves an extensive number of treatment options, and is a complaint that carries with it a high risk of complications and morbidity.  The differential diagnosis includes see AAA, cerebral venous thrombosis, migraine/tension/cluster headache, giant cell arteritis, MS, malignancy, other   Co morbidities that complicate the patient evaluation  See HPI   Additional history obtained:  Additional history obtained from EMR External records from outside source obtained and reviewed including hospital records   Lab Tests:  N/a   Imaging Studies ordered:  N/a   Cardiac Monitoring: / EKG:  The patient was maintained on a cardiac monitor.  I personally viewed and interpreted the cardiac monitored which showed an underlying rhythm of: sinus rhythm   Consultations Obtained:  N/a   Problem List / ED Course / Critical interventions / Medication management  Headache I ordered medication including Benadryl, Toradol, Reglan, normal saline, dexamethasone   Reevaluation of the patient after these medicines showed that the patient improved I have reviewed the patients home  medicines and have made adjustments as needed   Social Determinants of Health:  Chronic cigarette use.   Test / Admission - Considered:  Headache Vitals signs within normal range and stable throughout visit. 38 year old female presents emergency department with 1 weeks worth of intermittent headache.  Patient reports history of migraines and states this feels "the exact same."  Has had to have migraine cocktail in the past due to prolonged migraine type headache.  Nonfocal neuroexam.  No evidence clinically of meningismus.  No temporal artery tenderness discerning for GCA.  Given patient's history of similar symptoms in the past, CT imaging was foregone after shared decision making conversation was had with patient.  Treated with migraine cocktail and noted resolution of symptoms.  Suspect migraine type  headache.  Patient was breast-feeding but recently transition to strictly formula feeding of her infant.  Will prescribe again her Imitrex to use as needed as well as recommend follow-up with neurology if headaches continue to occur as she already has establish care.  Treatment plan discussed at length with patient and she acknowledged understanding was agreeable to said plan.  Patient overall well-appearing, afebrile in no acute distress. Worrisome signs and symptoms were discussed with the patient, and the patient acknowledged understanding to return to the ED if noticed. Patient was stable upon discharge.          Final Clinical Impression(s) / ED Diagnoses Final diagnoses:  Other migraine without status migrainosus, not intractable    Rx / DC Orders ED Discharge Orders          Ordered    SUMAtriptan (IMITREX) 100 MG tablet  Every 2 hours PRN        11/07/23 1241              Peter Garter, [place] 11/07/23 1248    Laurence Spates, MD 11/11/23 1442

## 2023-11-07 NOTE — ED Triage Notes (Signed)
C/o migraine x 1 week, hx of same. Light sensitivity, nausea.

## 2023-11-07 NOTE — ED Notes (Signed)
Discharge instructions reviewed with patient. Patient verbalizes understanding, no further questions at this time. Medications/prescriptions and follow up information provided. No acute distress noted at time of departure.  

## 2023-11-07 NOTE — Discharge Instructions (Addendum)
As discussed, we will send you home with Imitrex to take as needed for headache.  Recommend follow-up with your neurologist if symptoms return/recur.  These medications are not safe with breast-feeding so recommend switching over to formula as we discussed if you continue to take these medications.  Please do not hesitate to return to the emergency department if the worrisome signs and symptoms we discussed become apparent.

## 2023-12-03 ENCOUNTER — Emergency Department (HOSPITAL_BASED_OUTPATIENT_CLINIC_OR_DEPARTMENT_OTHER)
Admission: EM | Admit: 2023-12-03 | Discharge: 2023-12-03 | Disposition: A | Discharge status: Home / Self Care | Payer: BLUE CROSS/BLUE SHIELD

## 2023-12-03 ENCOUNTER — Other Ambulatory Visit: Payer: Self-pay

## 2023-12-03 ENCOUNTER — Encounter (HOSPITAL_BASED_OUTPATIENT_CLINIC_OR_DEPARTMENT_OTHER): Payer: Self-pay

## 2023-12-03 DIAGNOSIS — K0889 Other specified disorders of teeth and supporting structures: Secondary | ICD-10-CM | POA: Insufficient documentation

## 2023-12-03 MED ORDER — AMOXICILLIN-POT CLAVULANATE 875-125 MG PO TABS
1.0000 | ORAL_TABLET | Freq: Two times a day (BID) | ORAL | 0 refills | Status: AC
Start: 2023-12-03 — End: 2023-12-08

## 2023-12-03 MED ORDER — OXYCODONE-ACETAMINOPHEN 5-325 MG PO TABS: 1.0000 | ORAL_TABLET | Freq: Four times a day (QID) | ORAL | 0 refills | Status: AC | PRN

## 2023-12-03 NOTE — ED Triage Notes (Signed)
Pt states that she has a tooth abscess in back right of her mouth. Has been to the dentist and had a filling put in 12/9. Tooth pain never fully resolved, now in the past week is worsened again. Has attempted to call the office 3x and has not heard back.   Robina Ade, RN

## 2023-12-03 NOTE — ED Provider Notes (Signed)
[place] [facility] Provider Note   CSN: [id number] Arrival date & time: 12/03/23  0827     History  Chief Complaint  Patient presents with   Dental Pain    Kathryn Reid is a 38 y.o. female.  38 year old female presents emergency department for left lower molar pain.  Has had some issues with back in December when they put a temporary filling in.  She reports is never fully felt better.  Is had increasing pain over the past week.  No fevers no chills no angioedema.  Reports some pain to the jaw, no difficulty swallowing, no trismus, no changes in voice.   Dental Pain      Home Medications Prior to Admission medications   Medication Sig Start Date End Date Taking? Authorizing Provider  amoxicillin-clavulanate (AUGMENTIN) 875-125 MG tablet Take 1 tablet by mouth every 12 (twelve) hours for 5 days. 12/03/23 12/08/23 Yes Flossie Wexler, Harmon Dun, DO  oxyCODONE-acetaminophen (PERCOCET/ROXICET) 5-325 MG tablet Take 1 tablet by mouth every 6 (six) hours as needed for severe pain (pain score 7-10). 12/03/23  Yes Damiana Berrian, Harmon Dun, DO  Glecaprevir-Pibrentasvir (MAVYRET) 100-40 MG TABS Take 3 tablets by mouth daily with breakfast. 05/13/20   Blanchard Kelch, NP  oxybutynin (DITROPAN) 5 MG tablet Take 1 tablet by mouth daily. 03/27/16   [provider]  SUMAtriptan (IMITREX) 100 MG tablet Take 1 tablet (100 mg total) by mouth every 2 (two) hours as needed for migraine. Klas repeat in 2 hours if headache persists or recurs. Do not exceed 2 doses in 24 hours 11/07/23   Sherian Maroon A, PA  traZODone (DESYREL) 150 MG tablet Take 150 mg by mouth at bedtime.    [provider]      Allergies    Other and Bee venom    Review of Systems   Review of Systems  Physical Exam Updated Vital Signs BP 106/72 (BP Location: Left Arm)   Pulse 80   Temp 97.7 F (36.5 C) (Oral)   Resp 16   Ht 5\' 9"  (1.753 m)   Wt 92.5 kg   SpO2 99%   BMI 30.13 kg/m   Physical Exam Vitals and nursing note reviewed.  HENT:     Head: Normocephalic.     Comments: Some minor swelling about the left cheek/jaw.  No overlying cellulitis    Mouth/Throat:     Comments: Poor dentition, tooth #9 with filling, no obvious erythema, induration.  No periapical abscess.  Floor mouth is soft.  Uvula is midline.  No angioedema. Cardiovascular:     Rate and Rhythm: Normal rate.  Pulmonary:     Effort: Pulmonary effort is normal.  Musculoskeletal:     Cervical back: Neck supple. No tenderness.  Skin:    General: Skin is warm.     Capillary Refill: Capillary refill takes less than 2 seconds.  Neurological:     Mental Status: She is alert and oriented to person, place, and time.     ED Results / Procedures / Treatments   Labs (all labs ordered are listed, but only abnormal results are displayed) Labs Reviewed - No data to display  EKG None  Radiology No results found.  Procedures Procedures    Medications Ordered in ED Medications - No data to display  ED Course/ Medical Decision Making/ A&P  Medical Decision Making 38 year old female presenting emergency department for dental pain.  Afebrile vital signs reassuring.  Is having some mild swelling to the cheek/jaw.  No drainable periapical abscess.  Exam not consistent with PTA/retropharyngeal abscess.  No sign of Ludwick's.  Will forego labs as I have low suspicion for systemic infection.  Will forego CT scans as again I have low suspicion for deep space infection.  Discussed need for dentist follow-up.  Will give a short course of pain medications.  Will also give antibiotics.  Stable for discharge at this time.  Return precautions given.         Final Clinical Impression(s) / ED Diagnoses Final diagnoses:  Pain, dental    Rx / DC Orders ED Discharge Orders          Ordered    amoxicillin-clavulanate (AUGMENTIN) 875-125 MG tablet  Every 12 hours         12/03/23 0849    oxyCODONE-acetaminophen (PERCOCET/ROXICET) 5-325 MG tablet  Every 6 hours PRN        12/03/23 0850              Coral Spikes, DO 12/03/23 [phone]

## 2023-12-03 NOTE — Discharge Instructions (Addendum)
Please see a dentist as soon as possible.  We have prescribed you antibiotics to take.  You [place]me take over-the-counter Tylenol alternating with ibuprofen for baseline pain control.  We are prescribing you Percocet for breakthrough pain.  Return immediately felt fevers, chills, tongue or lip swelling, inability to swallow, changes in your voice, inability to open your mouth or you develop any new or worsening symptoms that are concerning to you.
